# Patient Record
Sex: Female | Born: 1970 | Race: Black or African American | Hispanic: No | Marital: Married | Smoking: Never smoker
Health system: Southern US, Community
[De-identification: ages and names within clinical notes are randomized; demographics above are authoritative.]

## PROBLEM LIST (undated history)

## (undated) DIAGNOSIS — Z8371 Family history of colonic polyps: Secondary | ICD-10-CM

## (undated) DIAGNOSIS — Z83719 Family history of colon polyps, unspecified: Secondary | ICD-10-CM

## (undated) DIAGNOSIS — M62830 Muscle spasm of back: Secondary | ICD-10-CM

## (undated) DIAGNOSIS — K219 Gastro-esophageal reflux disease without esophagitis: Secondary | ICD-10-CM

## (undated) DIAGNOSIS — R112 Nausea with vomiting, unspecified: Secondary | ICD-10-CM

## (undated) DIAGNOSIS — Z973 Presence of spectacles and contact lenses: Secondary | ICD-10-CM

## (undated) DIAGNOSIS — Z87898 Personal history of other specified conditions: Secondary | ICD-10-CM

## (undated) DIAGNOSIS — D649 Anemia, unspecified: Secondary | ICD-10-CM

## (undated) DIAGNOSIS — Z9889 Other specified postprocedural states: Secondary | ICD-10-CM

## (undated) DIAGNOSIS — Z8742 Personal history of other diseases of the female genital tract: Secondary | ICD-10-CM

## (undated) DIAGNOSIS — I499 Cardiac arrhythmia, unspecified: Secondary | ICD-10-CM

## (undated) HISTORY — DX: Personal history of other diseases of the female genital tract: Z87.42

## (undated) HISTORY — DX: Family history of colon polyps, unspecified: Z83.719

## (undated) HISTORY — DX: Personal history of other specified conditions: Z87.898

## (undated) HISTORY — PX: ABDOMINAL HYSTERECTOMY: SHX81

## (undated) HISTORY — DX: Gastro-esophageal reflux disease without esophagitis: K21.9

## (undated) HISTORY — PX: COLONOSCOPY: SHX174

## (undated) HISTORY — DX: Family history of colonic polyps: Z83.71

---

## 2003-10-25 HISTORY — PX: DILATION AND CURETTAGE OF UTERUS: SHX78

## 2005-10-24 HISTORY — PX: TUBAL LIGATION: SHX77

## 2005-12-21 ENCOUNTER — Observation Stay: Payer: Self-pay

## 2006-01-02 ENCOUNTER — Inpatient Hospital Stay: Payer: Self-pay

## 2007-04-23 ENCOUNTER — Encounter (INDEPENDENT_AMBULATORY_CARE_PROVIDER_SITE_OTHER): Payer: Self-pay | Admitting: *Deleted

## 2007-04-23 ENCOUNTER — Ambulatory Visit: Payer: Self-pay

## 2007-07-19 ENCOUNTER — Ambulatory Visit: Payer: Self-pay | Admitting: Cardiology

## 2008-01-09 LAB — HM MAMMOGRAPHY

## 2009-10-24 HISTORY — PX: DILATION AND CURETTAGE OF UTERUS: SHX78

## 2010-04-29 ENCOUNTER — Ambulatory Visit: Payer: Self-pay

## 2010-05-06 ENCOUNTER — Ambulatory Visit: Payer: Self-pay

## 2010-05-10 LAB — PATHOLOGY REPORT

## 2010-07-24 HISTORY — PX: LAPAROSCOPIC SUPRACERVICAL HYSTERECTOMY: SUR797

## 2010-08-04 ENCOUNTER — Ambulatory Visit: Payer: Self-pay

## 2010-08-12 ENCOUNTER — Ambulatory Visit: Payer: Self-pay

## 2011-03-08 NOTE — Assessment & Plan Note (Signed)
[facility]                            CARDIOLOGY OFFICE NOTE   NAME:Burson, Neera                        MRN:          [id number]  DATE:07/19/2007                            DOB:          August 14, 1971    Ms. Farace is a 40 year old female with no significant past medical  history who we are asked to evaluate for pre-syncope and orthostatic  symptoms. The patient has no prior cardiac history. Over the past one  year, she has noticed that she feels dizzy at times. This is when she  changes position suddenly. This would occur from the laying to the  sitting position, and also from the squatting to the standing position.  She will feel transiently dizzy and it resolves quickly. Note that she  has not had frank syncope. There is no associated shortness of breath,  chest pain, or palpitations. Note that she denies any dyspnea on  exertion, orthopnea, PND, or pedal edema. She did have an echocardiogram  performed in June of this year that showed normal LV function. 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 There was mild mitral valve prolapse  involving the anterior and posterior leaflets with mild mitral  regurgitation. She apparently has undergone a neurological evaluation  for the above symptoms. She has had an MRI/MRA of the brain, e.g.  carotid Dopplers. None of these were apparently remarkable per Dr.  Fredda Hammed notes. Because of the above, we were asked to further  evaluate.   MEDICATIONS:  She is on no medications at present.   ALLERGIES:  No known drug allergies.   SOCIAL HISTORY:  She does not smoke and only rarely consumes alcohol.   FAMILY HISTORY:  Negative for coronary artery disease, sudden death, or  arrhythmia.   PAST MEDICAL HISTORY:  There is no diabetes mellitus, hypertension, or  hyperlipidemia. She has had a prior [facility] and a tubal ligation, but there  are no other surgeries noted.   REVIEW OF SYSTEMS:  She denies any headaches or  previous chills. There  is no productive cough or hemoptysis. There is no dysphagia,  odynophagia, melena, or hematochezia. There is no dysuria or hematuria.  There is no rash or seizure activity. There is orthopnea, PND, or pedal  edema. The remaining systems are negative.   PHYSICAL EXAMINATION:  VITAL SIGNS:  In the laying position, her heart  rate is 67, with a blood pressure of 95/63. In the sitting position, her  heart rate is 72, with a blood pressure of 100/70. In the standing  position, she had a heart rate of 75 with a blood pressure of 90/62.  Note that she does state that her systolic blood pressure sometimes runs  in the 80 range.  GENERAL:  She well developed, well nourished and in no acute distress.  SKIN:  Warm and dry.  PSYCHIATRIC:  She does not appear depressed.  EXTREMITIES:  No peripheral clubbing.  BACK:  Normal.  HEENT:  Normal with normal eyelids.  NECK:  Supple with a normal upstroke bilaterally and there no bruits  noted. There is no jugular venous  distension and no thyromegaly is  noted.  CHEST:  Clear to auscultation in all expansion.  CARDIOVASCULAR:  Regular rate and rhythm, normal S1, S2. There are no  murmurs, rubs, or gallops noted. Her PMI is non-displaced.  ABDOMEN:  Nontender. Positive bowel sounds. No hepatosplenomegaly. No  masses appreciated. There is no abdominal bruit. She has 2+ femoral  pulses bilaterally and no bruits noted  EXTREMITIES:  No edema and I could palpate no cords. She has 2+  posterior tibial pulses bilaterally.  NEUROLOGIC:  Grossly intact.   Her electrocardiogram shows a sinus rhythm at a rate of 66. The axis is  normal. There are no ST changes. Her intervals are normal.   DIAGNOSES:  1. Dizziness/orthostasis type of symptoms - Ms. Canterbury is complaining      of some dizziness with standing only. This resolves fairly quickly      and does not appear to be significantly limiting. Note that her      systolic blood pressure  typically runs in the 90 range. Her      symptoms do sound to be orthostatic. I have asked her to      liberalized her salt intake, as well as her fluid intake.      Hopefully, this will correct the problem. If she has worsening      symptoms in the future, then we can consider adding medications to      improve her blood pressure, such as Proamatine. However, these do      not appear to be limiting at this point. Note that she has never      had any syncope. Her LV function is normal.  2. Mild mitral valve prolapse - she is not having any symptoms from      this. She may require follow up echocardiograms in the future.   I will see her back in six months.     Madolyn Frieze Jens Som, MD, [facility]  Electronically Signed    BSC/MedQ  DD: 07/19/2007  DT: 07/20/2007  Job #: [id number]   cc:   Mila Merry

## 2011-05-06 ENCOUNTER — Ambulatory Visit: Payer: Self-pay | Admitting: Urology

## 2011-05-20 ENCOUNTER — Ambulatory Visit: Payer: Self-pay | Admitting: Urology

## 2011-09-12 ENCOUNTER — Ambulatory Visit: Payer: Self-pay | Admitting: Gastroenterology

## 2011-09-13 LAB — PATHOLOGY REPORT

## 2011-10-25 LAB — HM PAP SMEAR: HM Pap smear: ABNORMAL

## 2013-01-07 ENCOUNTER — Ambulatory Visit: Payer: Self-pay | Admitting: Internal Medicine

## 2013-01-08 ENCOUNTER — Encounter: Payer: Self-pay | Admitting: Internal Medicine

## 2013-01-08 ENCOUNTER — Ambulatory Visit (INDEPENDENT_AMBULATORY_CARE_PROVIDER_SITE_OTHER): Payer: BC Managed Care – PPO | Admitting: Internal Medicine

## 2013-01-08 ENCOUNTER — Telehealth: Payer: Self-pay | Admitting: *Deleted

## 2013-01-08 VITALS — BP 108/60 | HR 79 | Temp 98.5°F | Ht 59.15 in | Wt 146.2 lb

## 2013-01-08 DIAGNOSIS — K5909 Other constipation: Secondary | ICD-10-CM

## 2013-01-08 DIAGNOSIS — N809 Endometriosis, unspecified: Secondary | ICD-10-CM

## 2013-01-08 DIAGNOSIS — K219 Gastro-esophageal reflux disease without esophagitis: Secondary | ICD-10-CM

## 2013-01-08 LAB — COMPREHENSIVE METABOLIC PANEL
ALT: 14 U/L (ref 0–35)
AST: 19 U/L (ref 0–37)
Albumin: 4.1 g/dL (ref 3.5–5.2)
Calcium: 8.9 mg/dL (ref 8.4–10.5)
Chloride: 105 mEq/L (ref 96–112)
Potassium: 3.8 mEq/L (ref 3.5–5.1)
Total Protein: 7.5 g/dL (ref 6.0–8.3)

## 2013-01-08 LAB — CBC WITH DIFFERENTIAL/PLATELET
Basophils Absolute: 0 10*3/uL (ref 0.0–0.1)
Basophils Relative: 0.4 % (ref 0.0–3.0)
Eosinophils Absolute: 0 10*3/uL (ref 0.0–0.7)
Lymphocytes Relative: 37.7 % (ref 12.0–46.0)
MCHC: 33.6 g/dL (ref 30.0–36.0)
Neutrophils Relative %: 53.5 % (ref 43.0–77.0)
RBC: 4.19 Mil/uL (ref 3.87–5.11)
RDW: 13.6 % (ref 11.5–14.6)

## 2013-01-08 LAB — LIPID PANEL
LDL Cholesterol: 110 mg/dL — ABNORMAL HIGH (ref 0–99)
Total CHOL/HDL Ratio: 3

## 2013-01-08 NOTE — Telephone Encounter (Signed)
Faxed letter to fax number listed below

## 2013-01-08 NOTE — Telephone Encounter (Signed)
Patient left message on voicemail stating she need a work note faxed to her job for her visit today. Please fax to [phone] or call back at [phone]

## 2013-01-09 ENCOUNTER — Telehealth: Payer: Self-pay | Admitting: Internal Medicine

## 2013-01-09 NOTE — Telephone Encounter (Signed)
Notified of labs via my chart 

## 2013-01-13 ENCOUNTER — Encounter: Payer: Self-pay | Admitting: Internal Medicine

## 2013-01-13 DIAGNOSIS — N809 Endometriosis, unspecified: Secondary | ICD-10-CM | POA: Insufficient documentation

## 2013-01-13 DIAGNOSIS — K219 Gastro-esophageal reflux disease without esophagitis: Secondary | ICD-10-CM | POA: Insufficient documentation

## 2013-01-13 DIAGNOSIS — R87619 Unspecified abnormal cytological findings in specimens from cervix uteri: Secondary | ICD-10-CM | POA: Insufficient documentation

## 2013-01-13 DIAGNOSIS — K5909 Other constipation: Secondary | ICD-10-CM | POA: Insufficient documentation

## 2013-01-13 NOTE — Progress Notes (Signed)
  Subjective:    Patient ID: Lorraine Morgan, female    DOB: [birth year], 42 y.o.   MRN: [id number]  HPI 42 year old female with past history of abnormal pap smears and endometriosis who comes in today to establish care.  Has been followed by Dr Sherrie Mustache.  Gets her gyn care at [facility].  Has been seeing Dr Luella Cook.  Has an appt next week.  He is following her for abnormal pap smears.  Had a normal pap last year.  Had a baseline mammogram at age 24.  Previously had problems with headaches.  Saw neurology.  Had an extensive w/up including MRI, EEG (normal) and EKG.  No headaches now.  Had some reflux issues and was evaluated by GI - Dr Marva Panda and [name].  Had EGD 09/12/11.  Treated for H. Pylori.  Has follow up planned with GI 5/14.  Some issues with constipation.  Had a colonoscopy 2003.  No urinary problems.    Past Medical History  Diagnosis Date  . History of abnormal Pap smear   . History of endometriosis   . GERD (gastroesophageal reflux disease)     H. pylori positive - EGD (09/12/11)     Outpatient Encounter Prescriptions as of 01/08/2013  Medication Sig Dispense Refill  . cyclobenzaprine (FLEXERIL) 5 MG tablet Take 5 mg by mouth as needed for muscle spasms.      . hydrocortisone (ANUSOL-HC) 25 MG suppository Place 25 mg rectally 2 (two) times daily as needed for hemorrhoids.      Marland Kitchen omeprazole (PRILOSEC) 20 MG capsule Take 20 mg by mouth daily.       No facility-administered encounter medications on file as of 01/08/2013.    Review of Systems Patient denies any headache, lightheadedness or dizziness.  No sinus or allergy issues.  No chest pain, tightness or palpitations.  No increased shortness of breath, cough or congestion.  No nausea or vomiting.  No increased acid reflux reported today.  Previously treated for H. Pylori.  On omeprazole.  No abdominal pain or cramping.  No bowel change, such as diarrhea,  BRBPR or melana.  Has had some chronic issues with constipation.  No urine  change.         Objective:   Physical Exam Filed Vitals:   01/08/13 1016  BP: 108/60  Pulse: 79  Temp: 98.5 F (33.58 C)   42 year old female in no acute distress.   HEENT:  Nares- clear.  Oropharynx - without lesions. NECK:  Supple.  Nontender.  No audible bruit.  HEART:  Appears to be regular. LUNGS:  No crackles or wheezing audible.  Respirations even and unlabored.  BREASTS:  Exam through gyn.  RADIAL PULSE:  Equal bilaterally.    ABDOMEN:  Soft, nontender.  Bowel sounds present and normal.  No audible abdominal bruit.  GU:  Exam performed through GYN.     EXTREMITIES:  No increased edema present.  DP pulses palpable and equal bilaterally.          Assessment & Plan:  HEALTH MAINTENANCE.  Planning to get her breast, pelvic and pap smear Monday 01/14/13 at [facility].  (Dr Luella Cook).  He will schedule her mammogram when due.  Check cholesterol.

## 2013-01-13 NOTE — Assessment & Plan Note (Signed)
EGD 11/12.  H. Pylori positive.  Treated.  On prilosec.  Follow.  Continues follow up with GI.

## 2013-01-13 NOTE — Assessment & Plan Note (Signed)
Seeing GI.  Follow.

## 2013-01-13 NOTE — Assessment & Plan Note (Signed)
Has a follow up with Dr Luella Cook on Monday 01/14/13 - for a repeat pap.  Obtain records.

## 2013-01-13 NOTE — Assessment & Plan Note (Signed)
Seeing GYN

## 2013-01-26 ENCOUNTER — Encounter: Payer: Self-pay | Admitting: Internal Medicine

## 2013-01-26 DIAGNOSIS — K219 Gastro-esophageal reflux disease without esophagitis: Secondary | ICD-10-CM

## 2013-03-26 ENCOUNTER — Other Ambulatory Visit: Payer: Self-pay | Admitting: *Deleted

## 2013-03-26 ENCOUNTER — Telehealth: Payer: Self-pay | Admitting: Emergency Medicine

## 2013-03-26 ENCOUNTER — Encounter: Payer: Self-pay | Admitting: Adult Health

## 2013-03-26 ENCOUNTER — Ambulatory Visit (INDEPENDENT_AMBULATORY_CARE_PROVIDER_SITE_OTHER): Payer: BC Managed Care – PPO | Admitting: Adult Health

## 2013-03-26 VITALS — BP 100/62 | HR 76 | Temp 98.3°F | Wt 145.0 lb

## 2013-03-26 DIAGNOSIS — H109 Unspecified conjunctivitis: Secondary | ICD-10-CM | POA: Insufficient documentation

## 2013-03-26 MED ORDER — CIPROFLOXACIN HCL 0.3 % OP SOLN: OPHTHALMIC | Status: DC

## 2013-03-26 MED ORDER — AZITHROMYCIN 1 % OP SOLN: 1.0000 [drp] | Freq: Two times a day (BID) | OPHTHALMIC | Status: DC

## 2013-03-26 NOTE — Telephone Encounter (Signed)
Patient stated she went to the pharmacy and was too expensive, she was told to call back if she could not afford. Please advise. She has also left a message on Becky's VM

## 2013-03-26 NOTE — Assessment & Plan Note (Signed)
Bilateral eyes. Start azithromycin ophthalmic solution one drop twice a day x1 day then 1 drop daily x4 days.

## 2013-03-26 NOTE — Telephone Encounter (Signed)
Ok to change to [employer] op

## 2013-03-26 NOTE — Progress Notes (Signed)
Pt called and stated azithromycin eye drops were too expensive, new Rx sent to pharmacy per Orville Govern, NP.

## 2013-03-26 NOTE — Patient Instructions (Addendum)
  Use Azithromycin Op Solution - 1 drop in both eyes twice a day for one day; then one drop in both eyes daily for 4 days  Cool compresses to both eyes  Gently remove any crusting  Wash hands to prevent contamination  Use a new pair of contacts once the infection has cleared.

## 2013-03-26 NOTE — Progress Notes (Signed)
  Subjective:    Patient ID: Lorraine Morgan, female    DOB: 1971-08-20, 42 y.o.   MRN: [id number]  HPI  Patient presents with crusting, drainage and matting of bilateral eyes. Symptoms began over the weekend. She removed her contacts and started to use her eyeglasses. Groin he sensation in eyes. No visual disturbances reported.  Current Outpatient Prescriptions on File Prior to Visit  Medication Sig Dispense Refill  . omeprazole (PRILOSEC) 20 MG capsule Take 20 mg by mouth daily.      . cyclobenzaprine (FLEXERIL) 5 MG tablet Take 5 mg by mouth as needed for muscle spasms.       No current facility-administered medications on file prior to visit.     Review of Systems  Constitutional: Negative for fever and chills.  Eyes: Positive for discharge, redness and itching. Negative for photophobia, pain and visual disturbance.       Objective:   Physical Exam  Constitutional: She is oriented to person, place, and time. She appears well-developed and well-nourished. No distress.  Eyes: EOM are normal. Pupils are equal, round, and reactive to light. Right eye exhibits discharge. Left eye exhibits discharge.  Neurological: She is alert and oriented to person, place, and time.  Psychiatric: She has a normal mood and affect. Her behavior is normal. Judgment and thought content normal.          Assessment & Plan:

## 2013-03-26 NOTE — Telephone Encounter (Signed)
Pt notified of new Rx sent to pharmacy.

## 2013-06-26 ENCOUNTER — Ambulatory Visit: Payer: BC Managed Care – PPO | Admitting: Adult Health

## 2013-08-29 ENCOUNTER — Other Ambulatory Visit: Payer: Self-pay

## 2013-12-09 ENCOUNTER — Telehealth: Payer: Self-pay | Admitting: Internal Medicine

## 2013-12-09 NOTE — Telephone Encounter (Signed)
Pt notified & will call back tomorrow for possible work in appointment if office is open

## 2013-12-09 NOTE — Telephone Encounter (Signed)
If continuing to flare, she probably needs to be evaluated - may need to be on oral prednisone.  I do not mind working her in tomorrow if we are here.  Since closing early - unable to work in today.

## 2013-12-09 NOTE — Telephone Encounter (Signed)
Pt wanted to make an appt.  Office closing early.  Wanted to give Dr. Lorin PicketScott a message:  Pt was seen at urgent care last Wednesday and they told her she has contact dermatitis.  It is on her hands and arms, mostly palms of hands.  She was given a shot of prednisone and topical cream to use.  States it is not helping.  Says it goes in and then flames up.  States it is now on her inner thigh as well.  Asking if she needs to try something something different.

## 2013-12-12 ENCOUNTER — Encounter: Payer: Self-pay | Admitting: Internal Medicine

## 2013-12-12 ENCOUNTER — Ambulatory Visit (INDEPENDENT_AMBULATORY_CARE_PROVIDER_SITE_OTHER): Payer: Commercial Managed Care - PPO | Admitting: Internal Medicine

## 2013-12-12 VITALS — BP 108/80 | HR 85 | Temp 98.2°F | Ht 59.15 in | Wt 146.0 lb

## 2013-12-12 DIAGNOSIS — R21 Rash and other nonspecific skin eruption: Secondary | ICD-10-CM

## 2013-12-15 ENCOUNTER — Encounter: Payer: Self-pay | Admitting: Internal Medicine

## 2013-12-15 DIAGNOSIS — R21 Rash and other nonspecific skin eruption: Secondary | ICD-10-CM | POA: Insufficient documentation

## 2013-12-15 NOTE — Progress Notes (Signed)
  Subjective:    Patient ID: Lorraine Morgan, female    DOB: 08/24/1971, 43 y.o.   MRN: [id number]  Rash  43 year old female with past history of abnormal pap smears and endometriosis who comes in today as a work in with concerns regarding a persistent rash.  States she first noticed "spots" on her hands.  The rash then spread up to her wrist and then up her forearm.  She also has noticed a few spots on the bottom of her feet and inner thighs.  Went to acute care.  Was given a steroid shot and given triamcinolone cream.  Rash persistent.  Increased itching.  Just started zyrtec.     Past Medical History  Diagnosis Date  . History of abnormal Pap smear   . History of endometriosis   . GERD (gastroesophageal reflux disease)     H. pylori positive - EGD (09/12/11)    Outpatient Encounter Prescriptions as of 12/12/2013  Medication Sig  . cyclobenzaprine (FLEXERIL) 5 MG tablet Take 5 mg by mouth as needed for muscle spasms.  . hydrocortisone (ANUSOL-HC) 25 MG suppository Place 25 mg rectally daily as needed for hemorrhoids.   Marland Kitchen. omeprazole (PRILOSEC) 20 MG capsule Take 20 mg by mouth daily.  Marland Kitchen. triamcinolone cream (KENALOG) 0.5 % Apply 1 application topically 2 (two) times daily.  . [DISCONTINUED] azithromycin (AZASITE) 1 % ophthalmic solution Place 1 drop into both eyes 2 (two) times daily.  . [DISCONTINUED] ciprofloxacin (CILOXAN) 0.3 % ophthalmic solution Administer 1 drop, every 2 hours, while awake, for 2 days. Then 1 drop, every 4 hours, while awake, for the next 5 days.    Review of Systems  Skin: Positive for rash.  Patient denies any headache, lightheadedness or dizziness.  No sinus or allergy issues.  No sore throat.  No fever.  No body aches.  No increased shortness of breath, cough or congestion.  No nausea or vomiting.  No bowel change.  Rash as outlined.           Objective:   Physical Exam  Filed Vitals:   12/12/13 1529  BP: 108/80  Pulse: 85  Temp: 98.2 F (4536.548 C)   43  year old female in no acute distress.   HEENT:  Nares- clear.  Oropharynx - without lesions. NECK:  Supple.  Nontender.  SKIN:  Erythematous based rash noted on wrist and forearm.  Minimal rash on soles of her feet and inner thighs.            Assessment & Plan:  HEALTH MAINTENANCE.  Pelvic[facility] pap smear and [facility]ograms [facility].

## 2013-12-15 NOTE — Assessment & Plan Note (Signed)
Persistent.  Itching.  No new exposures except for detergent.  No fever or other symptoms.  Concern over possible allergic reaction/contact dermatitis.  Continue zyrtec.  Medrol dose pack - 6 day taper.  Follow.  If persistent rash, will need dermatology referral.

## 2013-12-20 ENCOUNTER — Telehealth: Payer: Self-pay | Admitting: *Deleted

## 2013-12-20 DIAGNOSIS — R21 Rash and other nonspecific skin eruption: Secondary | ICD-10-CM

## 2013-12-20 NOTE — Telephone Encounter (Signed)
noted 

## 2013-12-20 NOTE — Telephone Encounter (Signed)
Pt was seen recently & calling to report that the itching started back after completing the steroid taper. The hives are back again. Had discussed dermatology referral as next step.Pt states that she is willing to proceed with referral.

## 2013-12-20 NOTE — Telephone Encounter (Signed)
I [facility].  They should be contacting her with an appt date and time.

## 2013-12-31 ENCOUNTER — Telehealth: Payer: Self-pay | Admitting: Internal Medicine

## 2013-12-31 DIAGNOSIS — Z13220 Encounter for screening for lipoid disorders: Secondary | ICD-10-CM

## 2013-12-31 DIAGNOSIS — R21 Rash and other nonspecific skin eruption: Secondary | ICD-10-CM

## 2013-12-31 NOTE — Telephone Encounter (Signed)
I have ordered the labs.  Please schedule her for a lab appt.  Thanks.

## 2013-12-31 NOTE — Telephone Encounter (Signed)
Pt left vm.  States she has cpe scheduled 3/26 and would like to come in sooner for fasting labs.  No orders in.  No lab appt scheduled at this time.   Pt states she can be reached today and tomorrow at work [phone] in [place] office, or Thursday and Friday will be working at [place] office at [phone].  Pt states after 5 p.m. She can be reached on her cell at [phone].

## 2014-01-01 NOTE — Telephone Encounter (Signed)
Appointment 3/20  Sent my chart message letting pt know about appointment

## 2014-01-08 ENCOUNTER — Other Ambulatory Visit (INDEPENDENT_AMBULATORY_CARE_PROVIDER_SITE_OTHER): Payer: Commercial Managed Care - PPO

## 2014-01-08 ENCOUNTER — Other Ambulatory Visit: Payer: Self-pay | Admitting: Internal Medicine

## 2014-01-08 DIAGNOSIS — Z13220 Encounter for screening for lipoid disorders: Secondary | ICD-10-CM

## 2014-01-08 DIAGNOSIS — R7989 Other specified abnormal findings of blood chemistry: Secondary | ICD-10-CM

## 2014-01-08 DIAGNOSIS — R946 Abnormal results of thyroid function studies: Secondary | ICD-10-CM

## 2014-01-08 DIAGNOSIS — R21 Rash and other nonspecific skin eruption: Secondary | ICD-10-CM

## 2014-01-08 LAB — CBC WITH DIFFERENTIAL/PLATELET
BASOS PCT: 0.5 % (ref 0.0–3.0)
Basophils Absolute: 0 10*3/uL (ref 0.0–0.1)
EOS PCT: 1 % (ref 0.0–5.0)
Eosinophils Absolute: 0 10*3/uL (ref 0.0–0.7)
HCT: 36.9 % (ref 36.0–46.0)
HEMOGLOBIN: 12.2 g/dL (ref 12.0–15.0)
LYMPHS PCT: 48.7 % — AB (ref 12.0–46.0)
Lymphs Abs: 2.1 10*3/uL (ref 0.7–4.0)
MCHC: 33.1 g/dL (ref 30.0–36.0)
MCV: 91.2 fl (ref 78.0–100.0)
MONOS PCT: 9.8 % (ref 3.0–12.0)
Monocytes Absolute: 0.4 10*3/uL (ref 0.1–1.0)
NEUTROS ABS: 1.7 10*3/uL (ref 1.4–7.7)
Neutrophils Relative %: 40 % — ABNORMAL LOW (ref 43.0–77.0)
Platelets: 226 10*3/uL (ref 150.0–400.0)
RBC: 4.05 Mil/uL (ref 3.87–5.11)
RDW: 13.3 % (ref 11.5–14.6)
WBC: 4.2 10*3/uL — ABNORMAL LOW (ref 4.5–10.5)

## 2014-01-08 LAB — COMPREHENSIVE METABOLIC PANEL
ALBUMIN: 4 g/dL (ref 3.5–5.2)
ALK PHOS: 47 U/L (ref 39–117)
ALT: 10 U/L (ref 0–35)
AST: 18 U/L (ref 0–37)
BILIRUBIN TOTAL: 0.6 mg/dL (ref 0.3–1.2)
BUN: 17 mg/dL (ref 6–23)
CO2: 26 mEq/L (ref 19–32)
Calcium: 8.9 mg/dL (ref 8.4–10.5)
Chloride: 106 mEq/L (ref 96–112)
Creatinine, Ser: 0.7 mg/dL (ref 0.4–1.2)
GFR: 113.34 mL/min (ref >60.00)
Glucose, Bld: 99 mg/dL (ref 70–99)
Potassium: 3.8 mEq/L (ref 3.5–5.1)
SODIUM: 138 meq/L (ref 135–145)
Total Protein: 6.8 g/dL (ref 6.0–8.3)

## 2014-01-08 LAB — TSH: TSH: 0.27 u[IU]/mL — ABNORMAL LOW (ref 0.35–5.50)

## 2014-01-08 LAB — LIPID PANEL
CHOLESTEROL: 185 mg/dL (ref 0–200)
HDL: 61.3 mg/dL (ref >39.00)
LDL CALC: 117 mg/dL — AB (ref 0–99)
Total CHOL/HDL Ratio: 3
Triglycerides: 35 mg/dL (ref 0.0–149.0)
VLDL: 7 mg/dL (ref 0.0–40.0)

## 2014-01-08 LAB — T3, FREE: T3, Free: 2.7 pg/mL (ref 2.3–4.2)

## 2014-01-08 LAB — T4, FREE: FREE T4: 0.8 ng/dL (ref 0.60–1.60)

## 2014-01-08 NOTE — Progress Notes (Signed)
Order placed for free t3 and free t4 

## 2014-01-09 ENCOUNTER — Encounter: Payer: Self-pay | Admitting: Internal Medicine

## 2014-01-10 ENCOUNTER — Other Ambulatory Visit: Payer: Commercial Managed Care - PPO

## 2014-01-13 NOTE — Telephone Encounter (Signed)
Mailed unread MyChart pt

## 2014-01-16 ENCOUNTER — Encounter: Payer: Self-pay | Admitting: Internal Medicine

## 2014-01-16 ENCOUNTER — Ambulatory Visit (INDEPENDENT_AMBULATORY_CARE_PROVIDER_SITE_OTHER): Payer: Commercial Managed Care - PPO | Admitting: Internal Medicine

## 2014-01-16 VITALS — BP 120/60 | HR 82 | Temp 98.3°F | Ht 60.0 in | Wt 147.5 lb

## 2014-01-16 DIAGNOSIS — Z8371 Family history of colonic polyps: Secondary | ICD-10-CM

## 2014-01-16 DIAGNOSIS — N809 Endometriosis, unspecified: Secondary | ICD-10-CM

## 2014-01-16 DIAGNOSIS — R21 Rash and other nonspecific skin eruption: Secondary | ICD-10-CM

## 2014-01-16 DIAGNOSIS — R7989 Other specified abnormal findings of blood chemistry: Secondary | ICD-10-CM

## 2014-01-16 DIAGNOSIS — Z83719 Family history of colon polyps, unspecified: Secondary | ICD-10-CM

## 2014-01-16 DIAGNOSIS — D72819 Decreased white blood cell count, unspecified: Secondary | ICD-10-CM

## 2014-01-16 DIAGNOSIS — K5909 Other constipation: Secondary | ICD-10-CM

## 2014-01-16 DIAGNOSIS — R946 Abnormal results of thyroid function studies: Secondary | ICD-10-CM

## 2014-01-16 DIAGNOSIS — K219 Gastro-esophageal reflux disease without esophagitis: Secondary | ICD-10-CM

## 2014-01-16 DIAGNOSIS — K5900 Constipation, unspecified: Secondary | ICD-10-CM

## 2014-01-16 DIAGNOSIS — Z800 Family history of malignant neoplasm of digestive organs: Secondary | ICD-10-CM

## 2014-01-16 MED ORDER — HYDROCORTISONE ACETATE 25 MG RE SUPP: 25.0000 mg | Freq: Every day | RECTAL | Status: DC | PRN

## 2014-01-16 NOTE — Assessment & Plan Note (Signed)
EGD 11/12.  H. Pylori positive.  Treated.  On prilosec.  Follow.  Continues follow up with GI.  Currently asymptomatic - no upper symptoms.

## 2014-01-16 NOTE — Progress Notes (Signed)
Subjective:    Patient ID: Lorraine Morgan, female    DOB: 09/26/1971, 43 y.o.   MRN: [id number]  HPI 43 year old female with past history of abnormal pap smears and endometriosis who comes in today for her physical exam.   [facility]  Has been seeing Lorraine Morgan.  Has an appt today.   He is following her for abnormal pap smears.  Had a baseline mammogram at age 89.  Just had a f/u mammogram today.  Previously had problems with headaches.  Saw neurology.  Had an extensive w/up including MRI, EEG (normal) and EKG.  No headaches now.  Had some reflux issues and was evaluated by GI - Lorraine Morgan and Lorraine Morgan.  Had EGD 09/12/11.  Treated for H. Pylori.  No signifcant issues with constipation.  Will notice some rectal pain intermittently.  Throbbing pain.  Suppositories help.   Had a colonoscopy 2003.  No urinary problems.  Father had polyps died of lung cancer.  Paternal grandmother and multiple great aunts and uncles - colon cancer.    Recently was evaluated by dermatology for persistent rash.  Treated for scabies x 2.  No significant change.  Got some better with prednisone.  Due for f/u 02/05/14.  Will probably get bx then.  Still unclear of etiology.     Past Medical History  Diagnosis Date  . History of abnormal Pap smear   . History of endometriosis   . GERD (gastroesophageal reflux disease)     H. pylori positive - EGD (09/12/11)    Outpatient Encounter Prescriptions as of 01/16/2014  Medication Sig  . cetirizine (ZYRTEC) 10 MG tablet Take 10 mg by mouth daily.  . cyclobenzaprine (FLEXERIL) 5 MG tablet Take 5 mg by mouth as needed for muscle spasms.  . hydrocortisone (ANUSOL-HC) 25 MG suppository Place 25 mg rectally daily as needed for hemorrhoids.   Marland Kitchen. omeprazole (PRILOSEC) 20 MG capsule Take 20 mg by mouth daily.  . [DISCONTINUED] triamcinolone cream (KENALOG) 0.5 % Apply 1 application topically 2 (two) times daily.    Review of Systems Patient denies any headache,  lightheadedness or dizziness.  No sinus or allergy issues.  No chest pain, tightness or palpitations.  No increased shortness of breath, cough or congestion.  No nausea or vomiting.  No increased acid reflux reported today.  Previously treated for H. Pylori.  On omeprazole.  No abdominal pain or cramping.  No bowel change, such as diarrhea,  BRBPR or melana.  No significant constipation.  Rectal burning at times.  No urine change.  Rash as outlined.        Objective:   Physical Exam  Filed Vitals:   01/16/14 1341  BP: 120/60  Pulse: 82  Temp: 98.3 F (3736.868 C)   43 year old female in no acute distress.   HEENT:  Nares- clear.  Oropharynx - without lesions. NECK:  Supple.  Nontender.  No audible bruit.  HEART:  Appears to be regular. LUNGS:  No crackles or wheezing audible.  Respirations even and unlabored.  BREASTS:  Exam through gyn.  RADIAL PULSE:  Equal bilaterally.    ABDOMEN:  Soft, nontender.  Bowel sounds present and normal.  No audible abdominal bruit.  GU:  Exam performed through GYN.     EXTREMITIES:  No increased edema present.  DP pulses palpable and equal bilaterally.          Assessment & Plan:  HEALTH MAINTENANCE.  Got her breast, pelvic  and pap smear at [facility].  (Lorraine Morgan).  Had her mammogram today.   Check cholesterol.   I spent 25 minutes with the patient and more than 50% of the time was spent in consultation regarding the above.

## 2014-01-16 NOTE — Assessment & Plan Note (Signed)
Denies any significant constipation now.  Bowels stable.  Some rectal burning.  Refilled suppositories.  Refer to GI for colon evaluation - given bowel issues, persistent intermittent rectal irritation and family history.

## 2014-01-16 NOTE — Assessment & Plan Note (Signed)
Seeing dermatology.  Still unclear etiology.  Keep f/u appt with them.

## 2014-01-16 NOTE — Assessment & Plan Note (Signed)
Saw gyn today.  Had repeat pap.  Obtain records.

## 2014-01-16 NOTE — Progress Notes (Signed)
Pre-visit discussion using our clinic review tool. No additional management support is needed unless otherwise documented below in the visit note.  

## 2014-01-16 NOTE — Assessment & Plan Note (Signed)
Seeing GYN

## 2014-02-13 ENCOUNTER — Other Ambulatory Visit (INDEPENDENT_AMBULATORY_CARE_PROVIDER_SITE_OTHER): Payer: Commercial Managed Care - PPO

## 2014-02-13 ENCOUNTER — Encounter: Payer: Self-pay | Admitting: Internal Medicine

## 2014-02-13 DIAGNOSIS — D72819 Decreased white blood cell count, unspecified: Secondary | ICD-10-CM

## 2014-02-13 DIAGNOSIS — R7989 Other specified abnormal findings of blood chemistry: Secondary | ICD-10-CM

## 2014-02-13 DIAGNOSIS — R946 Abnormal results of thyroid function studies: Secondary | ICD-10-CM

## 2014-02-13 LAB — CBC WITH DIFFERENTIAL/PLATELET
BASOS ABS: 0 10*3/uL (ref 0.0–0.1)
Basophils Relative: 0.7 % (ref 0.0–3.0)
Eosinophils Absolute: 0 10*3/uL (ref 0.0–0.7)
Eosinophils Relative: 0.8 % (ref 0.0–5.0)
HEMATOCRIT: 39.3 % (ref 36.0–46.0)
HEMOGLOBIN: 13 g/dL (ref 12.0–15.0)
LYMPHS ABS: 2.3 10*3/uL (ref 0.7–4.0)
Lymphocytes Relative: 49.1 % — ABNORMAL HIGH (ref 12.0–46.0)
MCHC: 33 g/dL (ref 30.0–36.0)
MCV: 91.7 fl (ref 78.0–100.0)
MONO ABS: 0.5 10*3/uL (ref 0.1–1.0)
Monocytes Relative: 10.5 % (ref 3.0–12.0)
NEUTROS ABS: 1.8 10*3/uL (ref 1.4–7.7)
Neutrophils Relative %: 38.9 % — ABNORMAL LOW (ref 43.0–77.0)
PLATELETS: 193 10*3/uL (ref 150.0–400.0)
RBC: 4.28 Mil/uL (ref 3.87–5.11)
RDW: 13.6 % (ref 11.5–14.6)
WBC: 4.7 10*3/uL (ref 4.5–10.5)

## 2014-02-13 LAB — T3, FREE: T3 FREE: 2.7 pg/mL (ref 2.3–4.2)

## 2014-02-13 LAB — TSH: TSH: 0.36 u[IU]/mL (ref 0.35–5.50)

## 2014-02-13 LAB — T4, FREE: Free T4: 0.73 ng/dL (ref 0.60–1.60)

## 2014-12-01 LAB — HM PAP SMEAR: HM Pap smear: NORMAL

## 2015-01-28 LAB — HM MAMMOGRAPHY: HM Mammogram: NORMAL

## 2015-02-05 DIAGNOSIS — R928 Other abnormal and inconclusive findings on diagnostic imaging of breast: Secondary | ICD-10-CM | POA: Insufficient documentation

## 2015-07-15 ENCOUNTER — Ambulatory Visit (INDEPENDENT_AMBULATORY_CARE_PROVIDER_SITE_OTHER): Payer: BC Managed Care – PPO | Admitting: Nurse Practitioner

## 2015-07-15 ENCOUNTER — Encounter: Payer: Self-pay | Admitting: Nurse Practitioner

## 2015-07-15 VITALS — BP 102/60 | HR 61 | Temp 98.2°F | Resp 14 | Ht 60.0 in | Wt 139.4 lb

## 2015-07-15 DIAGNOSIS — Z0000 Encounter for general adult medical examination without abnormal findings: Secondary | ICD-10-CM

## 2015-07-15 NOTE — Patient Instructions (Signed)
Debrox ear wax removal system  Health Maintenance Adopting a healthy lifestyle and getting preventive care can go a long way to promote health and wellness. Talk with your health care provider about what schedule of regular examinations is right for you. This is a good chance for you to check in with your provider about disease prevention and staying healthy. In between checkups, there are plenty of things you can do on your own. Experts have done a lot of research about which lifestyle changes and preventive measures are most likely to keep you healthy. Ask your health care provider for more information. WEIGHT AND DIET  Eat a healthy diet  Be sure to include plenty of vegetables, fruits, low-fat dairy products, and lean protein.  Do not eat a lot of foods high in solid fats, added sugars, or salt.  Get regular exercise. This is one of the most important things you can do for your health.  Most adults should exercise for at least 150 minutes each week. The exercise should increase your heart rate and make you sweat (moderate-intensity exercise).  Most adults should also do strengthening exercises at least twice a week. This is in addition to the moderate-intensity exercise.  Maintain a healthy weight  Body mass index (BMI) is a measurement that can be used to identify possible weight problems. It estimates body fat based on height and weight. Your health care provider can help determine your BMI and help you achieve or maintain a healthy weight.  For females 61 years of age and older:   A BMI below 18.5 is considered underweight.  A BMI of 18.5 to 24.9 is normal.  A BMI of 25 to 29.9 is considered overweight.  A BMI of 30 and above is considered obese.  Watch levels of cholesterol and blood lipids  You should start having your blood tested for lipids and cholesterol at 42 years of age, then have this test every 5 years.  You may need to have your cholesterol levels checked more  often if:  Your lipid or cholesterol levels are high.  You are older than 42 years of age.  You are at high risk for heart disease.  CANCER SCREENING   Lung Cancer  Lung cancer screening is recommended for adults 89-43 years old who are at high risk for lung cancer because of a history of smoking.  A yearly low-dose CT scan of the lungs is recommended for people who:  Currently smoke.  Have quit within the past 15 years.  Have at least a 30-pack-year history of smoking. A pack year is smoking an average of one pack of cigarettes a day for 1 year.  Yearly screening should continue until it has been 15 years since you quit.  Yearly screening should stop if you develop a health problem that would prevent you from having lung cancer treatment.  Breast Cancer  Practice breast self-awareness. This means understanding how your breasts normally appear and feel.  It also means doing regular breast self-exams. Let your health care provider know about any changes, no matter how small.  If you are in your 20s or 30s, you should have a clinical breast exam (CBE) by a health care provider every 1-3 years as part of a regular health exam.  If you are 48 or older, have a CBE every year. Also consider having a breast X-ray (mammogram) every year.  If you have a family history of breast cancer, talk to your health care provider about  genetic screening.  If you are at high risk for breast cancer, talk to your health care provider about having an MRI and a mammogram every year.  Breast cancer gene (BRCA) assessment is recommended for women who have family members with BRCA-related cancers. BRCA-related cancers include:  Breast.  Ovarian.  Tubal.  Peritoneal cancers.  Results of the assessment will determine the need for genetic counseling and BRCA1 and BRCA2 testing. Cervical Cancer Routine pelvic examinations to screen for cervical cancer are no longer recommended for nonpregnant  women who are considered low risk for cancer of the pelvic organs (ovaries, uterus, and vagina) and who do not have symptoms. A pelvic examination may be necessary if you have symptoms including those associated with pelvic infections. Ask your health care provider if a screening pelvic exam is right for you.   The Pap test is the screening test for cervical cancer for women who are considered at risk.  If you had a hysterectomy for a problem that was not cancer or a condition that could lead to cancer, then you no longer need Pap tests.  If you are older than 65 years, and you have had normal Pap tests for the past 10 years, you no longer need to have Pap tests.  If you have had past treatment for cervical cancer or a condition that could lead to cancer, you need Pap tests and screening for cancer for at least 20 years after your treatment.  If you no longer get a Pap test, assess your risk factors if they change (such as having a new sexual partner). This can affect whether you should start being screened again.  Some women have medical problems that increase their chance of getting cervical cancer. If this is the case for you, your health care provider may recommend more frequent screening and Pap tests.  The human papillomavirus (HPV) test is another test that may be used for cervical cancer screening. The HPV test looks for the virus that can cause cell changes in the cervix. The cells collected during the Pap test can be tested for HPV.  The HPV test can be used to screen women 30 years of age and older. Getting tested for HPV can extend the interval between normal Pap tests from three to five years.  An HPV test also should be used to screen women of any age who have unclear Pap test results.  After 42 years of age, women should have HPV testing as often as Pap tests.  Colorectal Cancer  This type of cancer can be detected and often prevented.  Routine colorectal cancer screening  usually begins at 42 years of age and continues through 42 years of age.  Your health care provider may recommend screening at an earlier age if you have risk factors for colon cancer.  Your health care provider may also recommend using home test kits to check for hidden blood in the stool.  A small camera at the end of a tube can be used to examine your colon directly (sigmoidoscopy or colonoscopy). This is done to check for the earliest forms of colorectal cancer.  Routine screening usually begins at age 50.  Direct examination of the colon should be repeated every 5-10 years through 42 years of age. However, you may need to be screened more often if early forms of precancerous polyps or small growths are found. Skin Cancer  Check your skin from head to toe regularly.  Tell your health care provider about   any new moles or changes in moles, especially if there is a change in a mole's shape or color.  Also tell your health care provider if you have a mole that is larger than the size of a pencil eraser.  Always use sunscreen. Apply sunscreen liberally and repeatedly throughout the day.  Protect yourself by wearing long sleeves, pants, a wide-brimmed hat, and sunglasses whenever you are outside. HEART DISEASE, DIABETES, AND HIGH BLOOD PRESSURE   Have your blood pressure checked at least every 1-2 years. High blood pressure causes heart disease and increases the risk of stroke.  If you are between 55 years and 79 years old, ask your health care provider if you should take aspirin to prevent strokes.  Have regular diabetes screenings. This involves taking a blood sample to check your fasting blood sugar level.  If you are at a normal weight and have a low risk for diabetes, have this test once every three years after 42 years of age.  If you are overweight and have a high risk for diabetes, consider being tested at a younger age or more often. PREVENTING INFECTION  Hepatitis B  If  you have a higher risk for hepatitis B, you should be screened for this virus. You are considered at high risk for hepatitis B if:  You were born in a country where hepatitis B is common. Ask your health care provider which countries are considered high risk.  Your parents were born in a high-risk country, and you have not been immunized against hepatitis B (hepatitis B vaccine).  You have HIV or AIDS.  You use needles to inject street drugs.  You live with someone who has hepatitis B.  You have had sex with someone who has hepatitis B.  You get hemodialysis treatment.  You take certain medicines for conditions, including cancer, organ transplantation, and autoimmune conditions. Hepatitis C  Blood testing is recommended for:  Everyone born from 1945 through 1965.  Anyone with known risk factors for hepatitis C. Sexually transmitted infections (STIs)  You should be screened for sexually transmitted infections (STIs) including gonorrhea and chlamydia if:  You are sexually active and are younger than 42 years of age.  You are older than 42 years of age and your health care provider tells you that you are at risk for this type of infection.  Your sexual activity has changed since you were last screened and you are at an increased risk for chlamydia or gonorrhea. Ask your health care provider if you are at risk.  If you do not have HIV, but are at risk, it may be recommended that you take a prescription medicine daily to prevent HIV infection. This is called pre-exposure prophylaxis (PrEP). You are considered at risk if:  You are sexually active and do not regularly use condoms or know the HIV status of your partner(s).  You take drugs by injection.  You are sexually active with a partner who has HIV. Talk with your health care provider about whether you are at high risk of being infected with HIV. If you choose to begin PrEP, you should first be tested for HIV. You should then be  tested every 3 months for as long as you are taking PrEP.  PREGNANCY   If you are premenopausal and you may become pregnant, ask your health care provider about preconception counseling.  If you may become pregnant, take 400 to 800 micrograms (mcg) of folic acid every day.  If you want   to prevent pregnancy, talk to your health care provider about birth control (contraception). OSTEOPOROSIS AND MENOPAUSE   Osteoporosis is a disease in which the bones lose minerals and strength with aging. This can result in serious bone fractures. Your risk for osteoporosis can be identified using a bone density scan.  If you are 65 years of age or older, or if you are at risk for osteoporosis and fractures, ask your health care provider if you should be screened.  Ask your health care provider whether you should take a calcium or vitamin D supplement to lower your risk for osteoporosis.  Menopause may have certain physical symptoms and risks.  Hormone replacement therapy may reduce some of these symptoms and risks. Talk to your health care provider about whether hormone replacement therapy is right for you.  HOME CARE INSTRUCTIONS   Schedule regular health, dental, and eye exams.  Stay current with your immunizations.   Do not use any tobacco products including cigarettes, chewing tobacco, or electronic cigarettes.  If you are pregnant, do not drink alcohol.  If you are breastfeeding, limit how much and how often you drink alcohol.  Limit alcohol intake to no more than 1 drink per day for nonpregnant women. One drink equals 12 ounces of beer, 5 ounces of wine, or 1 ounces of hard liquor.  Do not use street drugs.  Do not share needles.  Ask your health care provider for help if you need support or information about quitting drugs.  Tell your health care provider if you often feel depressed.  Tell your health care provider if you have ever been abused or do not feel safe at home. Document  Released: 04/25/2011 Document Revised: 02/24/2014 Document Reviewed: 09/11/2013 [facility] Patient Information 2015 [place], [place]. This information is not intended to replace advice given to you by your health care provider. Make sure you discuss any questions you have with your health care provider.

## 2015-07-15 NOTE — Progress Notes (Signed)
Pre visit review using our clinic review tool, if applicable. No additional management support is needed unless otherwise documented below in the visit note. 

## 2015-07-15 NOTE — Progress Notes (Signed)
Patient ID: Lorraine Morgan, female    DOB: November 08, 1970  Age: 44 y.o. MRN: [id number]  CC: Annual Exam   HPI Lorraine Morgan presents for annual physical.   1) Health Maintenance-   Diet- No formal   Exercise- Walking occasionally   Immunizations- tdap 2012, flu shot last week  Mammogram- 01/2015  Pap- OB/GYN- pt has past abnormal PAP, hysterectomy  Colonoscopy in 2003 Dr. Servando Snare, repeat by end of year  Eye Exam- 2016   Dental Exam- UTD today  History Lorraine Morgan has a past medical history of History of abnormal Pap smear; History of endometriosis; and GERD (gastroesophageal reflux disease).   Lorraine Morgan has past surgical history that includes Laparoscopic supracervical hysterectomy (07/2010); Tubal ligation (2007); Dilation and curettage of uterus (2005); and Dilation and curettage of uterus (2011).   Her family history includes Cancer in her father, maternal grandfather, and paternal grandmother; Colon polyps in her father; Diabetes in her maternal grandmother; Heart disease in her paternal grandfather; Hypercholesterolemia in her mother.Lorraine Morgan reports that Lorraine Morgan has never smoked. Lorraine Morgan has never used smokeless tobacco. Lorraine Morgan reports that Lorraine Morgan does not drink alcohol or use illicit drugs.  Outpatient Prescriptions Prior to Visit  Medication Sig Dispense Refill  . cetirizine (ZYRTEC) 10 MG tablet Take 10 mg by mouth daily.    . cyclobenzaprine (FLEXERIL) 5 MG tablet Take 5 mg by mouth as needed for muscle spasms.    . hydrocortisone (ANUSOL-HC) 25 MG suppository Place 1 suppository (25 mg total) rectally daily as needed for hemorrhoids. 12 suppository 0  . omeprazole (PRILOSEC) 20 MG capsule Take 20 mg by mouth daily.     No facility-administered medications prior to visit.    ROS Review of Systems  Constitutional: Negative for fever, chills, diaphoresis and fatigue.  HENT: Negative for tinnitus and trouble swallowing.   Eyes: Negative for visual disturbance.  Respiratory: Negative for chest tightness,  shortness of breath and wheezing.   Cardiovascular: Negative for chest pain, palpitations and leg swelling.  Gastrointestinal: Positive for diarrhea. Negative for nausea, vomiting, constipation and blood in stool.       Last week; resolved on its own  Genitourinary: Negative for difficulty urinating.  Musculoskeletal: Negative for back pain and neck pain.  Skin: Negative for rash.  Neurological: Negative for dizziness, weakness, numbness and headaches.  Psychiatric/Behavioral: Negative for suicidal ideas and sleep disturbance. The patient is not nervous/anxious.     Objective:  BP 102/60 mmHg  Pulse 61  Temp(Src) 98.2 F (36.8 C)  Resp 14  Ht 5' (1.524 m)  Wt 139 lb 6.4 oz (63.231 kg)  BMI 27.22 kg/m2  SpO2 98%  LMP 08/10/2010  Physical Exam  Constitutional: Lorraine Morgan is oriented to person, place, and time. Lorraine Morgan appears well-developed and well-nourished. No distress.  HENT:  Head: Normocephalic and atraumatic.  Right Ear: External ear normal.  Left Ear: External ear normal.  Nose: Nose normal.  Mouth/Throat: Oropharynx is clear and moist. No oropharyngeal exudate.  TMs and canals clear on left and blocked by yellow cerumen.   Eyes: Conjunctivae and EOM are normal. Pupils are equal, round, and reactive to light. Right eye exhibits no discharge. Left eye exhibits no discharge. No scleral icterus.  Neck: Normal range of motion. Neck supple. No thyromegaly present.  Cardiovascular: Normal rate, regular rhythm, normal heart sounds and intact distal pulses.  Exam reveals no gallop and no friction rub.   No murmur heard. Pulmonary/Chest: Effort normal and breath sounds normal. No respiratory distress. Lorraine Morgan has no  wheezes. Lorraine Morgan has no rales. Lorraine Morgan exhibits no tenderness.  Abdominal: Soft. Bowel sounds are normal. Lorraine Morgan exhibits no distension and no mass. There is no tenderness. There is no rebound and no guarding.  Musculoskeletal: Normal range of motion. Lorraine Morgan exhibits no edema or tenderness.   Lymphadenopathy:    Lorraine Morgan has no cervical adenopathy.  Neurological: Lorraine Morgan is alert and oriented to person, place, and time. Lorraine Morgan has normal reflexes. No cranial nerve deficit. Lorraine Morgan exhibits normal muscle tone. Coordination normal.  Skin: Skin is warm and dry. No rash noted. Lorraine Morgan is not diaphoretic. No erythema. No pallor.  Psychiatric: Lorraine Morgan has a normal mood and affect. Her behavior is normal. Judgment and thought content normal.    Assessment & Plan:   Asuzena was seen today for annual exam.  Diagnoses and all orders for this visit:  Routine general medical examination at a health care facility -     CBC with Differential/Platelet -     Comprehensive metabolic panel -     Hemoglobin A1c -     Lipid panel -     TSH   I am having Lorraine Morgan maintain her omeprazole, cyclobenzaprine, cetirizine, and hydrocortisone.  No orders of the defined types were placed in this encounter.     Follow-up: Return in about 1 year (around 07/14/2016) for CPE.

## 2015-07-16 LAB — CBC WITH DIFFERENTIAL/PLATELET
Basophils Absolute: 0 10*3/uL (ref 0.0–0.1)
Basophils Relative: 0.6 % (ref 0.0–3.0)
EOS ABS: 0 10*3/uL (ref 0.0–0.7)
EOS PCT: 0.3 % (ref 0.0–5.0)
HCT: 41.4 % (ref 36.0–46.0)
HEMOGLOBIN: 13.8 g/dL (ref 12.0–15.0)
Lymphocytes Relative: 41.1 % (ref 12.0–46.0)
Lymphs Abs: 3.2 10*3/uL (ref 0.7–4.0)
MCHC: 33.3 g/dL (ref 30.0–36.0)
MCV: 90.8 fl (ref 78.0–100.0)
MONO ABS: 0.5 10*3/uL (ref 0.1–1.0)
Monocytes Relative: 6.5 % (ref 3.0–12.0)
Neutro Abs: 4 10*3/uL (ref 1.4–7.7)
Neutrophils Relative %: 51.5 % (ref 43.0–77.0)
Platelets: 201 10*3/uL (ref 150.0–400.0)
RBC: 4.56 Mil/uL (ref 3.87–5.11)
RDW: 13.5 % (ref 11.5–15.5)
WBC: 7.9 10*3/uL (ref 4.0–10.5)

## 2015-07-16 LAB — LIPID PANEL
Cholesterol: 179 mg/dL (ref 0–200)
HDL: 63.1 mg/dL (ref >39.00)
LDL Cholesterol: 105 mg/dL — ABNORMAL HIGH (ref 0–99)
NonHDL: 115.62
Total CHOL/HDL Ratio: 3
Triglycerides: 53 mg/dL (ref 0.0–149.0)
VLDL: 10.6 mg/dL (ref 0.0–40.0)

## 2015-07-16 LAB — COMPREHENSIVE METABOLIC PANEL
ALT: 10 U/L (ref 0–35)
AST: 19 U/L (ref 0–37)
Albumin: 4.3 g/dL (ref 3.5–5.2)
Alkaline Phosphatase: 48 U/L (ref 39–117)
BUN: 10 mg/dL (ref 6–23)
CO2: 27 mEq/L (ref 19–32)
Calcium: 9.7 mg/dL (ref 8.4–10.5)
Chloride: 103 mEq/L (ref 96–112)
Creatinine, Ser: 0.68 mg/dL (ref 0.40–1.20)
GFR: 122.1 mL/min (ref >60.00)
Glucose, Bld: 83 mg/dL (ref 70–99)
Potassium: 3.9 mEq/L (ref 3.5–5.1)
Sodium: 137 mEq/L (ref 135–145)
Total Bilirubin: 0.5 mg/dL (ref 0.2–1.2)
Total Protein: 7.6 g/dL (ref 6.0–8.3)

## 2015-07-16 LAB — HEMOGLOBIN A1C: Hgb A1c MFr Bld: 5.8 % (ref 4.6–6.5)

## 2015-07-16 LAB — TSH: TSH: 0.53 u[IU]/mL (ref 0.35–4.50)

## 2015-09-29 ENCOUNTER — Telehealth: Payer: Self-pay

## 2015-09-29 NOTE — Telephone Encounter (Signed)
-----   Message from Armandina GemmaMichelle L Edmonds sent at 09/23/2015  9:52 AM EST ----- Colon triage

## 2015-09-30 ENCOUNTER — Other Ambulatory Visit: Payer: Self-pay

## 2015-09-30 NOTE — Telephone Encounter (Signed)
Gastroenterology Pre-Procedure Review  Request Date:  Requesting Physician: Dr. Dale Durhamharlene Scott  PATIENT REVIEW QUESTIONS: The patient responded to the following health history questions as indicated:    1. Are you having any GI issues? no 2. Do you have a personal history of Polyps? no 3. Do you have a family history of Colon Cancer or Polyps? yes (Father, polyps, PGM colon cancer and uncles) 4. Diabetes Mellitus? no 5. Joint replacements in the past 12 months?no 6. Major health problems in the past 3 months?no 7. Any artificial heart valves, MVP, or defibrillator?no    MEDICATIONS & ALLERGIES:    Patient reports the following regarding taking any anticoagulation/antiplatelet therapy:   Plavix, Coumadin, Eliquis, Xarelto, Lovenox, Pradaxa, Brilinta, or Effient? No Aspirin? no  Patient confirms/reports the following medications:  Current Outpatient Prescriptions  Medication Sig Dispense Refill  . cetirizine (ZYRTEC) 10 MG tablet Take 10 mg by mouth daily.    . cyclobenzaprine (FLEXERIL) 5 MG tablet Take 5 mg by mouth as needed for muscle spasms.    . hydrocortisone (ANUSOL-HC) 25 MG suppository Place 1 suppository (25 mg total) rectally daily as needed for hemorrhoids. 12 suppository 0  . omeprazole (PRILOSEC) 20 MG capsule Take 20 mg by mouth daily.     No current facility-administered medications for this visit.    Patient confirms/reports the following allergies:  No Known Allergies  No orders of the defined types were placed in this encounter.    AUTHORIZATION INFORMATION Primary Insurance: 1D#: Group #:  Secondary Insurance: 1D#: Group #:  SCHEDULE INFORMATION: Date: 11/09/15 Time: Location: [facility]

## 2015-09-30 NOTE — Telephone Encounter (Signed)
Patient returned your call regarding Colonoscopy. Work # [phone]

## 2015-10-30 ENCOUNTER — Encounter: Payer: Self-pay | Admitting: *Deleted

## 2015-11-06 NOTE — Discharge Instructions (Signed)

## 2015-11-09 ENCOUNTER — Encounter
Admission: RE | Disposition: A | Discharge status: Home / Self Care | Payer: Self-pay | Source: Ambulatory Visit | Attending: Gastroenterology

## 2015-11-09 ENCOUNTER — Ambulatory Visit: Payer: BC Managed Care – PPO | Admitting: Anesthesiology

## 2015-11-09 ENCOUNTER — Ambulatory Visit
Admission: RE | Admit: 2015-11-09 | Discharge: 2015-11-09 | Disposition: A | Discharge status: Home / Self Care | Payer: BC Managed Care – PPO | Source: Ambulatory Visit | Attending: Gastroenterology | Admitting: Gastroenterology

## 2015-11-09 DIAGNOSIS — Z800 Family history of malignant neoplasm of digestive organs: Secondary | ICD-10-CM | POA: Diagnosis not present

## 2015-11-09 DIAGNOSIS — Z8371 Family history of colonic polyps: Secondary | ICD-10-CM | POA: Diagnosis not present

## 2015-11-09 DIAGNOSIS — K641 Second degree hemorrhoids: Secondary | ICD-10-CM | POA: Diagnosis not present

## 2015-11-09 DIAGNOSIS — Z1211 Encounter for screening for malignant neoplasm of colon: Secondary | ICD-10-CM | POA: Insufficient documentation

## 2015-11-09 DIAGNOSIS — Z90710 Acquired absence of both cervix and uterus: Secondary | ICD-10-CM | POA: Diagnosis not present

## 2015-11-09 DIAGNOSIS — Z801 Family history of malignant neoplasm of trachea, bronchus and lung: Secondary | ICD-10-CM | POA: Diagnosis not present

## 2015-11-09 DIAGNOSIS — K219 Gastro-esophageal reflux disease without esophagitis: Secondary | ICD-10-CM | POA: Diagnosis not present

## 2015-11-09 DIAGNOSIS — Z8249 Family history of ischemic heart disease and other diseases of the circulatory system: Secondary | ICD-10-CM | POA: Diagnosis not present

## 2015-11-09 DIAGNOSIS — Z79899 Other long term (current) drug therapy: Secondary | ICD-10-CM | POA: Diagnosis not present

## 2015-11-09 DIAGNOSIS — Z83719 Family history of colon polyps, unspecified: Secondary | ICD-10-CM | POA: Insufficient documentation

## 2015-11-09 DIAGNOSIS — M62830 Muscle spasm of back: Secondary | ICD-10-CM | POA: Insufficient documentation

## 2015-11-09 DIAGNOSIS — Z833 Family history of diabetes mellitus: Secondary | ICD-10-CM | POA: Diagnosis not present

## 2015-11-09 DIAGNOSIS — I499 Cardiac arrhythmia, unspecified: Secondary | ICD-10-CM | POA: Insufficient documentation

## 2015-11-09 HISTORY — DX: Anemia, unspecified: D64.9

## 2015-11-09 HISTORY — DX: Other specified postprocedural states: Z98.890

## 2015-11-09 HISTORY — DX: Nausea with vomiting, unspecified: R11.2

## 2015-11-09 HISTORY — DX: Muscle spasm of back: M62.830

## 2015-11-09 HISTORY — DX: Cardiac arrhythmia, unspecified: I49.9

## 2015-11-09 HISTORY — PX: COLONOSCOPY WITH PROPOFOL: SHX5780

## 2015-11-09 SURGERY — COLONOSCOPY WITH PROPOFOL
Anesthesia: Monitor Anesthesia Care | Wound class: Contaminated

## 2015-11-09 MED ORDER — SODIUM CHLORIDE 0.9 % IV SOLN: INTRAVENOUS | Status: DC

## 2015-11-09 MED ORDER — STERILE WATER FOR IRRIGATION IR SOLN
Status: DC | PRN
  Administered 2015-11-09: 10:00:00

## 2015-11-09 MED ORDER — LIDOCAINE HCL (CARDIAC) 20 MG/ML IV SOLN
INTRAVENOUS | Status: DC | PRN
  Administered 2015-11-09: 50 mg via INTRAVENOUS

## 2015-11-09 MED ORDER — OXYCODONE HCL 5 MG/5ML PO SOLN: 5.0000 mg | Freq: Once | ORAL | Status: DC | PRN

## 2015-11-09 MED ORDER — PROPOFOL 10 MG/ML IV BOLUS
INTRAVENOUS | Status: DC | PRN
  Administered 2015-11-09 (×3): 20 mg via INTRAVENOUS
  Administered 2015-11-09: 50 mg via INTRAVENOUS
  Administered 2015-11-09 (×2): 20 mg via INTRAVENOUS

## 2015-11-09 MED ORDER — LACTATED RINGERS IV SOLN
INTRAVENOUS | Status: DC
  Administered 2015-11-09: 09:00:00 via INTRAVENOUS

## 2015-11-09 MED ORDER — OXYCODONE HCL 5 MG PO TABS: 5.0000 mg | ORAL_TABLET | Freq: Once | ORAL | Status: DC | PRN

## 2015-11-09 SURGICAL SUPPLY — 28 items

## 2015-11-09 NOTE — Transfer of Care (Signed)
Immediate Anesthesia Transfer of Care Note  Patient: Lorraine Morgan  Procedure(s) Performed: Procedure(s): COLONOSCOPY WITH PROPOFOL (N/A)  Patient Location: PACU  Anesthesia Type: MAC  Level of Consciousness: awake, alert  and patient cooperative  Airway and Oxygen Therapy: Patient Spontanous Breathing and Patient connected to supplemental oxygen  Post-op Assessment: Post-op Vital signs reviewed, Patient's Cardiovascular Status Stable, Respiratory Function Stable, Patent Airway and No signs of Nausea or vomiting  Post-op Vital Signs: Reviewed and stable  Complications: No apparent anesthesia complications

## 2015-11-09 NOTE — Anesthesia Procedure Notes (Signed)
Procedure Name: MAC Performed by: Veverly Larimer Pre-anesthesia Checklist: Patient identified, Emergency Drugs available, Suction available, Timeout performed and Patient being monitored Patient Re-evaluated:Patient Re-evaluated prior to inductionOxygen Delivery Method: Nasal cannula Placement Confirmation: positive ETCO2       

## 2015-11-09 NOTE — Anesthesia Preprocedure Evaluation (Signed)
Anesthesia Evaluation  Patient identified by MRN, date of birth, ID band  Reviewed: NPO status   History of Anesthesia Complications (+) PONV and history of anesthetic complications  Airway Mallampati: II  TM Distance: >3 FB Neck ROM: full    Dental no notable dental hx.    Pulmonary neg pulmonary ROS,    Pulmonary exam normal        Cardiovascular Exercise Tolerance: Good Normal cardiovascular exam+ dysrhythmias (occas irreg beat - )      Neuro/Psych negative neurological ROS  negative psych ROS   GI/Hepatic Neg liver ROS, GERD  Medicated,  Endo/Other  negative endocrine ROS  Renal/GU negative Renal ROS  negative genitourinary   Musculoskeletal   Abdominal   Peds  Hematology negative hematology ROS (+)   Anesthesia Other Findings   Reproductive/Obstetrics TAH                              Anesthesia Physical Anesthesia Plan  ASA: II  Anesthesia Plan: MAC   Post-op Pain Management:    Induction:   Airway Management Planned:   Additional Equipment:   Intra-op Plan:   Post-operative Plan:   Informed Consent: I have reviewed the patients History and Physical, chart, labs and discussed the procedure including the risks, benefits and alternatives for the proposed anesthesia with the patient or authorized representative who has indicated his/her understanding and acceptance.     Plan Discussed with: CRNA  Anesthesia Plan Comments:         Anesthesia Quick Evaluation

## 2015-11-09 NOTE — Anesthesia Postprocedure Evaluation (Signed)
Anesthesia Post Note  Patient: Lorraine Morgan  Procedure(s) Performed: Procedure(s) (LRB): COLONOSCOPY WITH PROPOFOL (N/A)  Patient location during evaluation: PACU Anesthesia Type: MAC Level of consciousness: awake and alert Pain management: pain level controlled Vital Signs Assessment: post-procedure vital signs reviewed and stable Respiratory status: spontaneous breathing, nonlabored ventilation, respiratory function stable and patient connected to nasal cannula oxygen Cardiovascular status: stable and blood pressure returned to baseline Anesthetic complications: no    Reganne Messerschmidt
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
## 2015-11-09 NOTE — Op Note (Addendum)
[facility] Patient Name: Lorraine Morgan Procedure Date: 11/09/2015 9:29 AM MRN: [id number] Account #: [id number] Date of Birth: 11/10/1970 Admit Type: Outpatient Age: 45 Room: [facility] OR ROOM 01 Gender: Female Note Status: Supervisor Override Procedure:         Colonoscopy Indications:       Family history of colonic polyps in a first-degree relative Providers:         [name], MD Referring MD:      Dale [place], MD (Referring MD) Medicines:         Propofol per Anesthesia Complications:     No immediate complications. Procedure:         Pre-Anesthesia Assessment:                    - Prior to the procedure, a History and Physical was                     performed, and patient medications and allergies were                     reviewed. The patient's tolerance of previous anesthesia                     was also reviewed. The risks and benefits of the procedure                     and the sedation options and risks were discussed with the                     patient. All questions were answered, and informed consent                     was obtained. Prior Anticoagulants: The patient has taken                     no previous anticoagulant or antiplatelet agents. ASA                     Grade Assessment: II - A patient with mild systemic                     disease. After reviewing the risks and benefits, the                     patient was deemed in satisfactory condition to undergo                     the procedure.                    After obtaining informed consent, the colonoscope was                     passed under direct vision. Throughout the procedure, the                     patient's blood pressure, pulse, and oxygen saturations                     were monitored continuously. The Olympus CF H180AL                     colonoscope (S#: [id number]) was introduced through the anus  and advanced to the the cecum, identified  by appendiceal                     orifice and ileocecal valve. The colonoscopy was performed                     without difficulty. The patient tolerated the procedure                     well. The quality of the bowel preparation was excellent. Findings:      The perianal and digital rectal examinations were normal.      Non-bleeding internal hemorrhoids were found during retroflexion. The       hemorrhoids were Grade II (internal hemorrhoids that prolapse but reduce       spontaneously). Impression:        - Non-bleeding internal hemorrhoids.                    - No specimens collected. Recommendation:    - Repeat colonoscopy in 10 years for screening unless any                     change in family history or lower GI problems. Procedure Code(s): --- Professional ---                    [phone], Colonoscopy, flexible; diagnostic, including                     collection of specimen(s) by brushing or washing, when                     performed (separate procedure) Diagnosis Code(s): --- Professional ---                    Z83.71, Family history of colonic polyps CPT copyright 2014 American Medical Association. All rights reserved. The codes documented in this report are preliminary and upon coder review may  be revised to meet current compliance requirements. Midge Miniumarren Kimiko Common, MD 11/09/2015 9:49:23 AM This report has been signed electronically. Number of Addenda: 0 Note Initiated On: 11/09/2015 9:29 AM Scope Withdrawal Time: 0 hours 6 minutes 21 seconds  Total Procedure Duration: 0 hours 10 minutes 17 seconds       [facility]

## 2015-11-10 ENCOUNTER — Encounter: Payer: Self-pay | Admitting: Gastroenterology

## 2015-11-16 ENCOUNTER — Encounter: Payer: Self-pay | Admitting: Internal Medicine

## 2015-11-17 MED ORDER — OMEPRAZOLE 20 MG PO CPDR: 20.0000 mg | DELAYED_RELEASE_CAPSULE | Freq: Every day | ORAL | Status: DC

## 2015-11-17 NOTE — Telephone Encounter (Signed)
rx sent in for omeprazole #30 with 2 refills.

## 2016-01-25 ENCOUNTER — Encounter: Payer: Self-pay | Admitting: Nurse Practitioner

## 2016-01-25 ENCOUNTER — Ambulatory Visit (INDEPENDENT_AMBULATORY_CARE_PROVIDER_SITE_OTHER): Payer: BC Managed Care – PPO | Admitting: Nurse Practitioner

## 2016-01-25 VITALS — BP 102/64 | HR 67 | Temp 98.2°F | Ht 60.0 in | Wt 143.4 lb

## 2016-01-25 DIAGNOSIS — R079 Chest pain, unspecified: Secondary | ICD-10-CM | POA: Insufficient documentation

## 2016-01-25 MED ORDER — BUSPIRONE HCL 5 MG PO TABS: 5.0000 mg | ORAL_TABLET | Freq: Every day | ORAL | Status: DC
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
## 2016-01-25 NOTE — Assessment & Plan Note (Signed)
Not likely of cardiac etiology  I have personally interpreted her EKG results: NSR, normal rate, normal axis, no acute ST changes, no sign of LVH  Pt is tearful and discussed medications for care. Pt willing to try buspirone 5 mg at night x 4 weeks. FU in 4 weeks.

## 2016-01-25 NOTE — Patient Instructions (Signed)
Try 1 tablet at night for 30 days. Follow up to see if it is helpful.

## 2016-03-02 ENCOUNTER — Encounter: Payer: Self-pay | Admitting: Nurse Practitioner

## 2016-03-02 ENCOUNTER — Ambulatory Visit (INDEPENDENT_AMBULATORY_CARE_PROVIDER_SITE_OTHER): Payer: BC Managed Care – PPO | Admitting: Nurse Practitioner

## 2016-03-02 VITALS — BP 110/68 | HR 68 | Temp 98.3°F | Ht 60.0 in | Wt 142.1 lb

## 2016-03-02 DIAGNOSIS — F411 Generalized anxiety disorder: Secondary | ICD-10-CM | POA: Diagnosis not present

## 2016-03-02 MED ORDER — BUSPIRONE HCL 7.5 MG PO TABS: 7.5000 mg | ORAL_TABLET | Freq: Three times a day (TID) | ORAL | Status: DC

## 2016-03-02 MED ORDER — OMEPRAZOLE 20 MG PO CPDR: 20.0000 mg | DELAYED_RELEASE_CAPSULE | Freq: Every day | ORAL | Status: DC

## 2016-03-02 NOTE — Progress Notes (Signed)
Pre visit review using our clinic review tool, if applicable. No additional management support is needed unless otherwise documented below in the visit note. 

## 2016-03-02 NOTE — Assessment & Plan Note (Signed)
Trying the increased dosage of 7.5 mg up to 3 x a day- uses mainly at night. F/u with Dr. Lorin PicketScott for physical in the future See us if needed
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
## 2016-03-02 NOTE — Patient Instructions (Signed)
Try the new dosage and follow up for your physical in Sept with Dr. Lorin PicketScott.   Call us if you need us!

## 2016-06-15 LAB — HM MAMMOGRAPHY

## 2016-07-21 ENCOUNTER — Ambulatory Visit (INDEPENDENT_AMBULATORY_CARE_PROVIDER_SITE_OTHER): Payer: BC Managed Care – PPO | Admitting: Internal Medicine

## 2016-07-21 ENCOUNTER — Encounter: Payer: Self-pay | Admitting: Internal Medicine

## 2016-07-21 VITALS — BP 110/64 | HR 68 | Temp 97.8°F | Ht 60.05 in | Wt 140.2 lb

## 2016-07-21 DIAGNOSIS — F411 Generalized anxiety disorder: Secondary | ICD-10-CM | POA: Diagnosis not present

## 2016-07-21 DIAGNOSIS — K219 Gastro-esophageal reflux disease without esophagitis: Secondary | ICD-10-CM | POA: Diagnosis not present

## 2016-07-21 DIAGNOSIS — N809 Endometriosis, unspecified: Secondary | ICD-10-CM

## 2016-07-21 DIAGNOSIS — Z8371 Family history of colonic polyps: Secondary | ICD-10-CM

## 2016-07-21 DIAGNOSIS — Z13220 Encounter for screening for lipoid disorders: Secondary | ICD-10-CM | POA: Diagnosis not present

## 2016-07-21 DIAGNOSIS — Z0000 Encounter for general adult medical examination without abnormal findings: Secondary | ICD-10-CM

## 2016-07-21 LAB — CBC WITH DIFFERENTIAL/PLATELET
BASOS PCT: 0.6 % (ref 0.0–3.0)
Basophils Absolute: 0 10*3/uL (ref 0.0–0.1)
EOS ABS: 0 10*3/uL (ref 0.0–0.7)
EOS PCT: 0.8 % (ref 0.0–5.0)
HCT: 38 % (ref 36.0–46.0)
Hemoglobin: 12.9 g/dL (ref 12.0–15.0)
LYMPHS ABS: 2.2 10*3/uL (ref 0.7–4.0)
Lymphocytes Relative: 47.7 % — ABNORMAL HIGH (ref 12.0–46.0)
MCHC: 34 g/dL (ref 30.0–36.0)
MCV: 88.6 fl (ref 78.0–100.0)
MONO ABS: 0.5 10*3/uL (ref 0.1–1.0)
Monocytes Relative: 9.9 % (ref 3.0–12.0)
NEUTROS ABS: 1.9 10*3/uL (ref 1.4–7.7)
Neutrophils Relative %: 41 % — ABNORMAL LOW (ref 43.0–77.0)
PLATELETS: 203 10*3/uL (ref 150.0–400.0)
RBC: 4.29 Mil/uL (ref 3.87–5.11)
RDW: 13.3 % (ref 11.5–15.5)
WBC: 4.6 10*3/uL (ref 4.0–10.5)

## 2016-07-21 LAB — COMPREHENSIVE METABOLIC PANEL
ALBUMIN: 4 g/dL (ref 3.5–5.2)
ALT: 10 U/L (ref 0–35)
AST: 18 U/L (ref 0–37)
Alkaline Phosphatase: 62 U/L (ref 39–117)
BUN: 15 mg/dL (ref 6–23)
CHLORIDE: 104 meq/L (ref 96–112)
CO2: 30 meq/L (ref 19–32)
CREATININE: 0.81 mg/dL (ref 0.40–1.20)
Calcium: 9 mg/dL (ref 8.4–10.5)
GFR: 99.29 mL/min (ref >60.00)
Glucose, Bld: 95 mg/dL (ref 70–99)
POTASSIUM: 4.2 meq/L (ref 3.5–5.1)
SODIUM: 140 meq/L (ref 135–145)
Total Bilirubin: 0.4 mg/dL (ref 0.2–1.2)
Total Protein: 7.3 g/dL (ref 6.0–8.3)

## 2016-07-21 LAB — TSH: TSH: 0.41 u[IU]/mL (ref 0.35–4.50)

## 2016-07-21 LAB — LIPID PANEL
CHOLESTEROL: 191 mg/dL (ref 0–200)
HDL: 66.3 mg/dL (ref >39.00)
LDL Cholesterol: 114 mg/dL — ABNORMAL HIGH (ref 0–99)
NonHDL: 124.81
Total CHOL/HDL Ratio: 3
Triglycerides: 52 mg/dL (ref 0.0–149.0)
VLDL: 10.4 mg/dL (ref 0.0–40.0)

## 2016-07-21 NOTE — Progress Notes (Signed)
Pre visit review using our clinic review tool, if applicable. No additional management support is needed unless otherwise documented below in the visit note. 
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
## 2016-07-22 ENCOUNTER — Encounter: Payer: Self-pay | Admitting: Internal Medicine

## 2016-07-22 MED ORDER — CYCLOBENZAPRINE HCL 5 MG PO TABS: 5.0000 mg | ORAL_TABLET | Freq: Every day | ORAL | 0 refills | Status: DC | PRN

## 2016-07-22 NOTE — Telephone Encounter (Signed)
rx sent in for flexeril.  

## 2016-07-24 ENCOUNTER — Encounter: Payer: Self-pay | Admitting: Internal Medicine

## 2016-07-24 DIAGNOSIS — Z0000 Encounter for general adult medical examination without abnormal findings: Secondary | ICD-10-CM | POA: Insufficient documentation

## 2016-07-24 NOTE — Assessment & Plan Note (Signed)
On buspar.  Doing well with this medication.  Follow.

## 2016-07-24 NOTE — Assessment & Plan Note (Signed)
Had colonoscopy 11/09/15.  Recommended f/u in five years.

## 2016-07-24 NOTE — Assessment & Plan Note (Signed)
Followed by gyn

## 2016-07-24 NOTE — Assessment & Plan Note (Signed)
On omeprazole.  Symptoms controlled.  

## 2016-07-24 NOTE — Assessment & Plan Note (Signed)
Physical today 07/21/16.  PAP through gyn.  Colonoscopy 11/09/15 as outlined.  Mammogram 06/15/16 - Birads II.

## 2016-08-10 ENCOUNTER — Other Ambulatory Visit: Payer: Self-pay | Admitting: Nurse Practitioner

## 2017-07-21 ENCOUNTER — Ambulatory Visit (INDEPENDENT_AMBULATORY_CARE_PROVIDER_SITE_OTHER): Payer: BC Managed Care – PPO | Admitting: Internal Medicine

## 2017-07-21 ENCOUNTER — Encounter: Payer: Self-pay | Admitting: Internal Medicine

## 2017-07-21 VITALS — BP 110/62 | HR 67 | Temp 98.6°F | Resp 14 | Ht 60.0 in | Wt 142.4 lb

## 2017-07-21 DIAGNOSIS — Z13220 Encounter for screening for lipoid disorders: Secondary | ICD-10-CM

## 2017-07-21 DIAGNOSIS — R300 Dysuria: Secondary | ICD-10-CM

## 2017-07-21 DIAGNOSIS — Z8371 Family history of colonic polyps: Secondary | ICD-10-CM | POA: Diagnosis not present

## 2017-07-21 DIAGNOSIS — N3000 Acute cystitis without hematuria: Secondary | ICD-10-CM

## 2017-07-21 DIAGNOSIS — K219 Gastro-esophageal reflux disease without esophagitis: Secondary | ICD-10-CM

## 2017-07-21 DIAGNOSIS — R87619 Unspecified abnormal cytological findings in specimens from cervix uteri: Secondary | ICD-10-CM

## 2017-07-21 DIAGNOSIS — F411 Generalized anxiety disorder: Secondary | ICD-10-CM

## 2017-07-21 DIAGNOSIS — Z0000 Encounter for general adult medical examination without abnormal findings: Secondary | ICD-10-CM | POA: Diagnosis not present

## 2017-07-21 LAB — CBC WITH DIFFERENTIAL/PLATELET
Basophils Absolute: 0 10*3/uL (ref 0.0–0.1)
Basophils Relative: 0.6 % (ref 0.0–3.0)
Eosinophils Absolute: 0 10*3/uL (ref 0.0–0.7)
Eosinophils Relative: 0.5 % (ref 0.0–5.0)
HCT: 39.7 % (ref 36.0–46.0)
Hemoglobin: 12.8 g/dL (ref 12.0–15.0)
LYMPHS ABS: 2.6 10*3/uL (ref 0.7–4.0)
Lymphocytes Relative: 50.5 % — ABNORMAL HIGH (ref 12.0–46.0)
MCHC: 32.3 g/dL (ref 30.0–36.0)
MCV: 93 fl (ref 78.0–100.0)
Monocytes Absolute: 0.5 10*3/uL (ref 0.1–1.0)
Monocytes Relative: 9.8 % (ref 3.0–12.0)
NEUTROS PCT: 38.6 % — AB (ref 43.0–77.0)
Neutro Abs: 2 10*3/uL (ref 1.4–7.7)
Platelets: 204 10*3/uL (ref 150.0–400.0)
RBC: 4.26 Mil/uL (ref 3.87–5.11)
RDW: 13.7 % (ref 11.5–15.5)
WBC: 5.2 10*3/uL (ref 4.0–10.5)

## 2017-07-21 LAB — COMPREHENSIVE METABOLIC PANEL
ALT: 8 U/L (ref 0–35)
AST: 16 U/L (ref 0–37)
Albumin: 4.3 g/dL (ref 3.5–5.2)
Alkaline Phosphatase: 62 U/L (ref 39–117)
BUN: 8 mg/dL (ref 6–23)
CHLORIDE: 99 meq/L (ref 96–112)
CO2: 28 meq/L (ref 19–32)
Calcium: 9.5 mg/dL (ref 8.4–10.5)
Creatinine, Ser: 0.72 mg/dL (ref 0.40–1.20)
GFR: 113.22 mL/min (ref >60.00)
Glucose, Bld: 93 mg/dL (ref 70–99)
POTASSIUM: 3.9 meq/L (ref 3.5–5.1)
SODIUM: 134 meq/L — AB (ref 135–145)
TOTAL PROTEIN: 6.8 g/dL (ref 6.0–8.3)
Total Bilirubin: 0.5 mg/dL (ref 0.2–1.2)

## 2017-07-21 LAB — LIPID PANEL
CHOLESTEROL: 197 mg/dL (ref 0–200)
HDL: 57.4 mg/dL (ref >39.00)
LDL CALC: 126 mg/dL — AB (ref 0–99)
NonHDL: 139.64
TRIGLYCERIDES: 67 mg/dL (ref 0.0–149.0)
Total CHOL/HDL Ratio: 3
VLDL: 13.4 mg/dL (ref 0.0–40.0)

## 2017-07-21 LAB — TSH: TSH: 0.33 u[IU]/mL — ABNORMAL LOW (ref 0.35–4.50)

## 2017-07-21 MED ORDER — BUSPIRONE HCL 7.5 MG PO TABS: 7.5000 mg | ORAL_TABLET | Freq: Three times a day (TID) | ORAL | 1 refills | Status: DC

## 2017-07-21 MED ORDER — OMEPRAZOLE 20 MG PO CPDR: 20.0000 mg | DELAYED_RELEASE_CAPSULE | Freq: Every day | ORAL | 1 refills | Status: DC
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
## 2017-07-22 LAB — URINE CULTURE
MICRO NUMBER: 81078691
SPECIMEN QUALITY:: ADEQUATE

## 2017-07-23 ENCOUNTER — Encounter: Payer: Self-pay | Admitting: Internal Medicine

## 2017-07-23 ENCOUNTER — Other Ambulatory Visit: Payer: Self-pay | Admitting: Internal Medicine

## 2017-07-23 DIAGNOSIS — E871 Hypo-osmolality and hyponatremia: Secondary | ICD-10-CM

## 2017-07-23 DIAGNOSIS — R7989 Other specified abnormal findings of blood chemistry: Secondary | ICD-10-CM

## 2017-07-23 NOTE — Assessment & Plan Note (Signed)
Had colonoscopy 10/2015.  Recommended f/u in 5 years.

## 2017-07-23 NOTE — Assessment & Plan Note (Signed)
Followed by gyn (Dr Chauncey Cruel).  Scheduled for f/u in 08/2017.

## 2017-07-23 NOTE — Assessment & Plan Note (Signed)
Stable on buspar.  Follow.

## 2017-07-23 NOTE — Assessment & Plan Note (Signed)
Physical today.  Pelvic/pap smears through gyn (Dr Chauncey Cruel).  Up to date.  Has follow up scheduled in 08/2017.  Colonoscopy 11/09/15 as outlined.  Gyn will schedule mammogram.  Last 06/15/16 - Birads II.

## 2017-07-23 NOTE — Progress Notes (Signed)
Order placed for f/u labs.  

## 2017-07-23 NOTE — Assessment & Plan Note (Signed)
Controlled on omeprazole.   

## 2017-08-07 ENCOUNTER — Other Ambulatory Visit (INDEPENDENT_AMBULATORY_CARE_PROVIDER_SITE_OTHER): Payer: BC Managed Care – PPO

## 2017-08-07 DIAGNOSIS — R7989 Other specified abnormal findings of blood chemistry: Secondary | ICD-10-CM

## 2017-08-07 DIAGNOSIS — E871 Hypo-osmolality and hyponatremia: Secondary | ICD-10-CM | POA: Diagnosis not present

## 2017-08-08 LAB — SODIUM: Sodium: 138 mEq/L (ref 135–145)

## 2017-08-08 LAB — TSH: TSH: 0.5 u[IU]/mL (ref 0.35–4.50)

## 2017-08-09 ENCOUNTER — Encounter: Payer: Self-pay | Admitting: Internal Medicine
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
## 2017-08-25 ENCOUNTER — Ambulatory Visit (INDEPENDENT_AMBULATORY_CARE_PROVIDER_SITE_OTHER): Payer: BC Managed Care – PPO | Admitting: Obstetrics and Gynecology

## 2017-08-25 ENCOUNTER — Encounter: Payer: Self-pay | Admitting: Obstetrics and Gynecology

## 2017-08-25 VITALS — BP 102/52 | HR 86 | Ht 60.0 in | Wt 145.0 lb

## 2017-08-25 DIAGNOSIS — Z01419 Encounter for gynecological examination (general) (routine) without abnormal findings: Secondary | ICD-10-CM | POA: Diagnosis not present

## 2017-08-25 DIAGNOSIS — Z1239 Encounter for other screening for malignant neoplasm of breast: Secondary | ICD-10-CM

## 2017-08-25 DIAGNOSIS — Z1231 Encounter for screening mammogram for malignant neoplasm of breast: Secondary | ICD-10-CM

## 2017-08-25 NOTE — Patient Instructions (Signed)
Preventive Care 18-39 Years, Female Preventive care refers to lifestyle choices and visits with your health care provider that can promote health and wellness. What does preventive care include?  A yearly physical exam. This is also called an annual well check.  Dental exams once or twice a year.  Routine eye exams. Ask your health care provider how often you should have your eyes checked.  Personal lifestyle choices, including: ? Daily care of your teeth and gums. ? Regular physical activity. ? Eating a healthy diet. ? Avoiding tobacco and drug use. ? Limiting alcohol use. ? Practicing safe sex. ? Taking vitamin and mineral supplements as recommended by your health care provider. What happens during an annual well check? The services and screenings done by your health care provider during your annual well check will depend on your age, overall health, lifestyle risk factors, and family history of disease. Counseling Your health care provider may ask you questions about your:  Alcohol use.  Tobacco use.  Drug use.  Emotional well-being.  Home and relationship well-being.  Sexual activity.  Eating habits.  Work and work Statistician.  Method of birth control.  Menstrual cycle.  Pregnancy history.  Screening You may have the following tests or measurements:  Height, weight, and BMI.  Diabetes screening. This is done by checking your blood sugar (glucose) after you have not eaten for a while (fasting).  Blood pressure.  Lipid and cholesterol levels. These may be checked every 5 years starting at age 66.  Skin check.  Hepatitis C blood test.  Hepatitis B blood test.  Sexually transmitted disease (STD) testing.  BRCA-related cancer screening. This may be done if you have a family history of breast, ovarian, tubal, or peritoneal cancers.  Pelvic exam and Pap test. This may be done every 3 years starting at age 40. Starting at age 59, this may be done every 5  years if you have a Pap test in combination with an HPV test.  Discuss your test results, treatment options, and if necessary, the need for more tests with your health care provider. Vaccines Your health care provider may recommend certain vaccines, such as:  Influenza vaccine. This is recommended every year.  Tetanus, diphtheria, and acellular pertussis (Tdap, Td) vaccine. You may need a Td booster every 10 years.  Varicella vaccine. You may need this if you have not been vaccinated.  HPV vaccine. If you are 69 or younger, you may need three doses over 6 months.  Measles, mumps, and rubella (MMR) vaccine. You may need at least one dose of MMR. You may also need a second dose.  Pneumococcal 13-valent conjugate (PCV13) vaccine. You may need this if you have certain conditions and were not previously vaccinated.  Pneumococcal polysaccharide (PPSV23) vaccine. You may need one or two doses if you smoke cigarettes or if you have certain conditions.  Meningococcal vaccine. One dose is recommended if you are age 27-21 years and a first-year college student living in a residence hall, or if you have one of several medical conditions. You may also need additional booster doses.  Hepatitis A vaccine. You may need this if you have certain conditions or if you travel or work in places where you may be exposed to hepatitis A.  Hepatitis B vaccine. You may need this if you have certain conditions or if you travel or work in places where you may be exposed to hepatitis B.  Haemophilus influenzae type b (Hib) vaccine. You may need this if  you have certain risk factors.  Talk to your health care provider about which screenings and vaccines you need and how often you need them. This information is not intended to replace advice given to you by your health care provider. Make sure you discuss any questions you have with your health care provider. Document Released: 12/06/2001 Document Revised: 06/29/2016  Document Reviewed: 08/11/2015 Elsevier Interactive Patient Education  2017 [employer].

## 2017-11-03 ENCOUNTER — Encounter: Payer: Self-pay | Admitting: Obstetrics and Gynecology

## 2018-02-22 ENCOUNTER — Encounter: Payer: Self-pay | Admitting: Obstetrics and Gynecology

## 2018-02-22 ENCOUNTER — Ambulatory Visit: Payer: BC Managed Care – PPO | Admitting: Obstetrics and Gynecology

## 2018-02-22 VITALS — BP 104/70 | HR 78 | Ht 60.0 in | Wt 150.0 lb

## 2018-02-22 DIAGNOSIS — N6320 Unspecified lump in the left breast, unspecified quadrant: Secondary | ICD-10-CM | POA: Diagnosis not present
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
## 2018-02-22 NOTE — Patient Instructions (Signed)
I value your feedback and entrusting us with your care. If you get a [facility] patient survey, I would appreciate you taking the time to let us know about your experience today. Thank you! 

## 2018-05-29 ENCOUNTER — Telehealth: Payer: Self-pay

## 2018-05-29 NOTE — Telephone Encounter (Signed)
Normally CPE's are scheduled with the PCP, I will forward to DR. Scott patient requesting CPE before November do you have somewhere I can schedule?

## 2018-05-29 NOTE — Telephone Encounter (Signed)
Morrie Sheldonshley, can you schedule cpe for her.  See her note.  (apparently needs after 07/21/18).  I can get with you and work her in somewhere if necessary.  Should have opening before 08/2018.  Thanks

## 2018-05-29 NOTE — Telephone Encounter (Signed)
Copied from CRM [phone]. Topic: Appointment Scheduling - Scheduling Inquiry for Clinic >> May 29, 2018  3:41 PM Maia Pettiesrtiz, Kristie S wrote: Reason for CRM: Pt called to schedule CPE. Last was 07/21/17. Dr. Lorin PicketScott not available until November. Pt asking if CPE can be scheduled with PA or NP. Please advise.

## 2018-05-30 NOTE — Telephone Encounter (Signed)
Called and scheduled pt

## 2018-06-18 ENCOUNTER — Encounter: Payer: Self-pay | Admitting: Obstetrics and Gynecology

## 2018-07-23 ENCOUNTER — Encounter: Payer: Self-pay | Admitting: Internal Medicine

## 2018-07-23 NOTE — Telephone Encounter (Signed)
Patient will eat a light lunch by 12 so she can have her labs done

## 2018-07-24 ENCOUNTER — Ambulatory Visit (INDEPENDENT_AMBULATORY_CARE_PROVIDER_SITE_OTHER): Payer: BC Managed Care – PPO | Admitting: Internal Medicine

## 2018-07-24 ENCOUNTER — Encounter: Payer: Self-pay | Admitting: Internal Medicine

## 2018-07-24 VITALS — BP 102/70 | HR 77 | Temp 97.9°F | Resp 18 | Ht 60.0 in | Wt 147.8 lb

## 2018-07-24 DIAGNOSIS — R928 Other abnormal and inconclusive findings on diagnostic imaging of breast: Secondary | ICD-10-CM

## 2018-07-24 DIAGNOSIS — Z0000 Encounter for general adult medical examination without abnormal findings: Secondary | ICD-10-CM

## 2018-07-24 DIAGNOSIS — R87619 Unspecified abnormal cytological findings in specimens from cervix uteri: Secondary | ICD-10-CM

## 2018-07-24 DIAGNOSIS — Z13220 Encounter for screening for lipoid disorders: Secondary | ICD-10-CM | POA: Diagnosis not present

## 2018-07-24 DIAGNOSIS — N809 Endometriosis, unspecified: Secondary | ICD-10-CM

## 2018-07-24 DIAGNOSIS — F411 Generalized anxiety disorder: Secondary | ICD-10-CM

## 2018-07-24 DIAGNOSIS — K219 Gastro-esophageal reflux disease without esophagitis: Secondary | ICD-10-CM | POA: Diagnosis not present

## 2018-07-24 MED ORDER — OMEPRAZOLE 20 MG PO CPDR: 20.0000 mg | DELAYED_RELEASE_CAPSULE | Freq: Every day | ORAL | 1 refills | Status: DC

## 2018-07-24 MED ORDER — BUSPIRONE HCL 7.5 MG PO TABS: 7.5000 mg | ORAL_TABLET | Freq: Three times a day (TID) | ORAL | 1 refills | Status: DC
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
## 2018-07-25 ENCOUNTER — Other Ambulatory Visit: Payer: Self-pay | Admitting: Internal Medicine

## 2018-07-25 ENCOUNTER — Other Ambulatory Visit (INDEPENDENT_AMBULATORY_CARE_PROVIDER_SITE_OTHER): Payer: BC Managed Care – PPO

## 2018-07-25 DIAGNOSIS — R7989 Other specified abnormal findings of blood chemistry: Secondary | ICD-10-CM

## 2018-07-25 LAB — COMPREHENSIVE METABOLIC PANEL
ALT: 14 U/L (ref 0–35)
AST: 20 U/L (ref 0–37)
Albumin: 4.3 g/dL (ref 3.5–5.2)
Alkaline Phosphatase: 54 U/L (ref 39–117)
BILIRUBIN TOTAL: 0.4 mg/dL (ref 0.2–1.2)
BUN: 10 mg/dL (ref 6–23)
CALCIUM: 9.6 mg/dL (ref 8.4–10.5)
CO2: 28 meq/L (ref 19–32)
CREATININE: 0.88 mg/dL (ref 0.40–1.20)
Chloride: 103 mEq/L (ref 96–112)
GFR: 89.4 mL/min (ref >60.00)
GLUCOSE: 86 mg/dL (ref 70–99)
Potassium: 4.1 mEq/L (ref 3.5–5.1)
Sodium: 138 mEq/L (ref 135–145)
Total Protein: 7.4 g/dL (ref 6.0–8.3)

## 2018-07-25 LAB — CBC WITH DIFFERENTIAL/PLATELET
BASOS ABS: 0.1 10*3/uL (ref 0.0–0.1)
Basophils Relative: 1.1 % (ref 0.0–3.0)
EOS ABS: 0.1 10*3/uL (ref 0.0–0.7)
Eosinophils Relative: 1.1 % (ref 0.0–5.0)
HEMATOCRIT: 38.1 % (ref 36.0–46.0)
Hemoglobin: 12.7 g/dL (ref 12.0–15.0)
LYMPHS PCT: 50 % — AB (ref 12.0–46.0)
Lymphs Abs: 3.5 10*3/uL (ref 0.7–4.0)
MCHC: 33.2 g/dL (ref 30.0–36.0)
MCV: 89.2 fl (ref 78.0–100.0)
Monocytes Absolute: 0.6 10*3/uL (ref 0.1–1.0)
Monocytes Relative: 9.2 % (ref 3.0–12.0)
NEUTROS ABS: 2.7 10*3/uL (ref 1.4–7.7)
Neutrophils Relative %: 38.6 % — ABNORMAL LOW (ref 43.0–77.0)
PLATELETS: 218 10*3/uL (ref 150.0–400.0)
RBC: 4.27 Mil/uL (ref 3.87–5.11)
RDW: 13.1 % (ref 11.5–15.5)
WBC: 6.9 10*3/uL (ref 4.0–10.5)

## 2018-07-25 LAB — LIPID PANEL
CHOL/HDL RATIO: 3
CHOLESTEROL: 188 mg/dL (ref 0–200)
HDL: 58 mg/dL (ref >39.00)
LDL CALC: 110 mg/dL — AB (ref 0–99)
NONHDL: 130.15
Triglycerides: 101 mg/dL (ref 0.0–149.0)
VLDL: 20.2 mg/dL (ref 0.0–40.0)

## 2018-07-25 LAB — T4, FREE: Free T4: 0.88 ng/dL (ref 0.60–1.60)

## 2018-07-25 LAB — TSH: TSH: 0.29 u[IU]/mL — ABNORMAL LOW (ref 0.35–4.50)

## 2018-07-25 LAB — T3, FREE: T3 FREE: 3.2 pg/mL (ref 2.3–4.2)

## 2018-07-25 NOTE — Progress Notes (Signed)
Order placed for add on labs.   °

## 2018-07-27 ENCOUNTER — Encounter: Payer: Self-pay | Admitting: *Deleted

## 2018-07-29 ENCOUNTER — Encounter: Payer: Self-pay | Admitting: Internal Medicine

## 2018-07-29 NOTE — Assessment & Plan Note (Signed)
Stable on buspar.  Follow.   

## 2018-07-29 NOTE — Assessment & Plan Note (Signed)
Controlled on omeprazole.   

## 2018-07-29 NOTE — Assessment & Plan Note (Signed)
Had mammogram 11/03/17.  Recommended f/u left breast mammogram - performed 11/20/17 - birads III.  Recommended f/u in 6 months.  She plans to schedule.

## 2018-07-29 NOTE — Assessment & Plan Note (Signed)
Followed by gyn

## 2018-07-29 NOTE — Assessment & Plan Note (Signed)
Physical today 07/26/18.  Pelvic/pap smears through gyn.  Breast exam through there as well.  Mammogram as outlined.  Colonoscopy 10/2015.  Recommended f/u in 5 years.

## 2018-07-29 NOTE — Assessment & Plan Note (Signed)
Followed by gyn - Dr Chauncey Cruel.  Sees gyn.  Up to date.

## 2018-09-03 ENCOUNTER — Other Ambulatory Visit (INDEPENDENT_AMBULATORY_CARE_PROVIDER_SITE_OTHER): Payer: 59

## 2018-09-03 ENCOUNTER — Other Ambulatory Visit (HOSPITAL_COMMUNITY)
Admission: RE | Admit: 2018-09-03 | Discharge: 2018-09-03 | Disposition: A | Discharge status: Home / Self Care | Payer: 59 | Source: Ambulatory Visit | Attending: Obstetrics and Gynecology | Admitting: Obstetrics and Gynecology

## 2018-09-03 ENCOUNTER — Encounter: Payer: Self-pay | Admitting: Obstetrics and Gynecology

## 2018-09-03 ENCOUNTER — Ambulatory Visit (INDEPENDENT_AMBULATORY_CARE_PROVIDER_SITE_OTHER): Payer: BC Managed Care – PPO | Admitting: Obstetrics and Gynecology

## 2018-09-03 VITALS — BP 100/60 | HR 66 | Wt 151.0 lb

## 2018-09-03 DIAGNOSIS — Z01419 Encounter for gynecological examination (general) (routine) without abnormal findings: Secondary | ICD-10-CM

## 2018-09-03 DIAGNOSIS — R7989 Other specified abnormal findings of blood chemistry: Secondary | ICD-10-CM

## 2018-09-03 DIAGNOSIS — Z1239 Encounter for other screening for malignant neoplasm of breast: Secondary | ICD-10-CM

## 2018-09-03 DIAGNOSIS — Z124 Encounter for screening for malignant neoplasm of cervix: Secondary | ICD-10-CM | POA: Insufficient documentation

## 2018-09-03 LAB — T3, FREE: T3 FREE: 3.1 pg/mL (ref 2.3–4.2)

## 2018-09-03 LAB — T4, FREE: FREE T4: 0.68 ng/dL (ref 0.60–1.60)

## 2018-09-03 LAB — TSH: TSH: 0.45 u[IU]/mL (ref 0.35–4.50)

## 2018-09-03 LAB — HM PAP SMEAR: HM Pap smear: NEGATIVE
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
## 2018-09-04 ENCOUNTER — Encounter: Payer: Self-pay | Admitting: Internal Medicine

## 2018-09-05 LAB — CYTOLOGY - PAP
Diagnosis: NEGATIVE
HPV: NOT DETECTED

## 2018-09-06 ENCOUNTER — Other Ambulatory Visit: Payer: BC Managed Care – PPO

## 2018-10-10 ENCOUNTER — Telehealth: Payer: Self-pay

## 2018-10-10 NOTE — Telephone Encounter (Signed)
Advise

## 2018-10-10 NOTE — Telephone Encounter (Signed)
[facility].  Needs order.  [phone] or p 4:30 call cell [phone]

## 2018-10-12 ENCOUNTER — Other Ambulatory Visit: Payer: Self-pay | Admitting: Internal Medicine

## 2018-10-12 ENCOUNTER — Other Ambulatory Visit: Payer: Self-pay | Admitting: Obstetrics and Gynecology

## 2018-10-12 DIAGNOSIS — R921 Mammographic calcification found on diagnostic imaging of breast: Secondary | ICD-10-CM

## 2018-10-18 ENCOUNTER — Other Ambulatory Visit: Payer: Self-pay | Admitting: Obstetrics and Gynecology

## 2018-10-18 DIAGNOSIS — R921 Mammographic calcification found on diagnostic imaging of breast: Secondary | ICD-10-CM

## 2018-10-18 DIAGNOSIS — R928 Other abnormal and inconclusive findings on diagnostic imaging of breast: Secondary | ICD-10-CM

## 2018-10-18 NOTE — Telephone Encounter (Signed)
Order is in.

## 2018-11-12 ENCOUNTER — Ambulatory Visit
Admission: RE | Admit: 2018-11-12 | Discharge: 2018-11-12 | Disposition: A | Discharge status: Home / Self Care | Payer: BC Managed Care – PPO | Source: Ambulatory Visit | Attending: Obstetrics and Gynecology | Admitting: Obstetrics and Gynecology

## 2018-11-12 ENCOUNTER — Encounter: Payer: Self-pay | Admitting: Internal Medicine

## 2018-11-12 DIAGNOSIS — R921 Mammographic calcification found on diagnostic imaging of breast: Secondary | ICD-10-CM

## 2018-11-12 NOTE — Telephone Encounter (Signed)
I can see her at 12:30 on 11/21/18.  See if she can come in at this time and add her to the schedule.  Thanks.

## 2018-11-12 NOTE — Telephone Encounter (Signed)
Confirmed nothing acute going on with patient. She had stomach bug about 4 weeks ago and after that she normally has increased acid reflux. Patient thought that it would get better but still has not. She is still having increased acid reflux, some nausea and stomach discomfort. She is ok to wait until next week to be seen. Was not sure if you were ok to do work in next Wednesday. Advised that you were only in the office half of the day that day but could work her in over the next week if not.

## 2018-11-21 ENCOUNTER — Encounter: Payer: Self-pay | Admitting: Internal Medicine

## 2018-11-21 ENCOUNTER — Ambulatory Visit (INDEPENDENT_AMBULATORY_CARE_PROVIDER_SITE_OTHER): Payer: 59 | Admitting: Internal Medicine

## 2018-11-21 DIAGNOSIS — R1013 Epigastric pain: Secondary | ICD-10-CM | POA: Diagnosis not present

## 2018-11-21 DIAGNOSIS — K219 Gastro-esophageal reflux disease without esophagitis: Secondary | ICD-10-CM

## 2018-11-21 DIAGNOSIS — R109 Unspecified abdominal pain: Secondary | ICD-10-CM

## 2018-11-21 LAB — CBC WITH DIFFERENTIAL/PLATELET
BASOS ABS: 0 10*3/uL (ref 0.0–0.1)
Basophils Relative: 0.4 % (ref 0.0–3.0)
Eosinophils Absolute: 0 10*3/uL (ref 0.0–0.7)
Eosinophils Relative: 0.7 % (ref 0.0–5.0)
HCT: 40.5 % (ref 36.0–46.0)
Hemoglobin: 13.5 g/dL (ref 12.0–15.0)
Lymphocytes Relative: 39.8 % (ref 12.0–46.0)
Lymphs Abs: 2.2 10*3/uL (ref 0.7–4.0)
MCHC: 33.4 g/dL (ref 30.0–36.0)
MCV: 90.7 fl (ref 78.0–100.0)
MONO ABS: 0.6 10*3/uL (ref 0.1–1.0)
Monocytes Relative: 10.4 % (ref 3.0–12.0)
Neutro Abs: 2.6 10*3/uL (ref 1.4–7.7)
Neutrophils Relative %: 48.7 % (ref 43.0–77.0)
Platelets: 213 10*3/uL (ref 150.0–400.0)
RBC: 4.47 Mil/uL (ref 3.87–5.11)
RDW: 13.1 % (ref 11.5–15.5)
WBC: 5.4 10*3/uL (ref 4.0–10.5)

## 2018-11-21 LAB — BASIC METABOLIC PANEL
BUN: 11 mg/dL (ref 6–23)
CO2: 29 mEq/L (ref 19–32)
Calcium: 9.3 mg/dL (ref 8.4–10.5)
Chloride: 104 mEq/L (ref 96–112)
Creatinine, Ser: 0.7 mg/dL (ref 0.40–1.20)
GFR: 109.37 mL/min (ref >60.00)
Glucose, Bld: 88 mg/dL (ref 70–99)
Potassium: 4.5 mEq/L (ref 3.5–5.1)
Sodium: 138 mEq/L (ref 135–145)

## 2018-11-21 LAB — LIPASE: Lipase: 29 U/L (ref 11.0–59.0)

## 2018-11-21 LAB — HEPATIC FUNCTION PANEL
ALK PHOS: 62 U/L (ref 39–117)
ALT: 13 U/L (ref 0–35)
AST: 19 U/L (ref 0–37)
Albumin: 4.4 g/dL (ref 3.5–5.2)
Bilirubin, Direct: 0.1 mg/dL (ref 0.0–0.3)
Total Bilirubin: 0.3 mg/dL (ref 0.2–1.2)
Total Protein: 7.3 g/dL (ref 6.0–8.3)

## 2018-11-21 LAB — AMYLASE: Amylase: 65 U/L (ref 27–131)

## 2018-11-21 MED ORDER — OMEPRAZOLE 20 MG PO CPDR: 20.0000 mg | DELAYED_RELEASE_CAPSULE | Freq: Every day | ORAL | 1 refills | Status: DC

## 2018-11-21 NOTE — Patient Instructions (Signed)
Take pepcid 20mg  30 minutes before breakfast  Take prilosec (omeprazole) 30 minutes before your evening meal.
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
## 2018-11-22 ENCOUNTER — Ambulatory Visit: Payer: 59

## 2018-11-24 ENCOUNTER — Encounter: Payer: Self-pay | Admitting: Internal Medicine

## 2018-11-24 NOTE — Assessment & Plan Note (Addendum)
Abdominal pain (epigastric pain) as outlined.  Also with associated nausea and decreased appetite.  Persistent.  Check labs including cbc, metabolic panel, amylase, lipase and liver function tests.  Also check abdominal ultrasound.  Continue prilosec daily in the evening and pepcid in the am.

## 2018-11-24 NOTE — Assessment & Plan Note (Signed)
On prilosec.  Continue.   

## 2018-11-28 ENCOUNTER — Encounter: Payer: Self-pay | Admitting: Internal Medicine

## 2018-11-28 NOTE — Telephone Encounter (Signed)
I gave the results of CT to you today. I can call pt if needed

## 2019-01-25 ENCOUNTER — Encounter: Payer: Self-pay | Admitting: Internal Medicine

## 2019-01-29 ENCOUNTER — Ambulatory Visit (INDEPENDENT_AMBULATORY_CARE_PROVIDER_SITE_OTHER): Payer: 59 | Admitting: Internal Medicine

## 2019-01-29 ENCOUNTER — Encounter: Payer: Self-pay | Admitting: Internal Medicine

## 2019-01-29 DIAGNOSIS — G479 Sleep disorder, unspecified: Secondary | ICD-10-CM | POA: Diagnosis not present

## 2019-01-29 DIAGNOSIS — K219 Gastro-esophageal reflux disease without esophagitis: Secondary | ICD-10-CM | POA: Diagnosis not present

## 2019-01-29 DIAGNOSIS — F411 Generalized anxiety disorder: Secondary | ICD-10-CM

## 2019-01-29 DIAGNOSIS — R1013 Epigastric pain: Secondary | ICD-10-CM

## 2019-01-29 MED ORDER — TRAZODONE HCL 50 MG PO TABS: 25.0000 mg | ORAL_TABLET | Freq: Every evening | ORAL | 1 refills | Status: DC | PRN

## 2019-01-29 NOTE — Assessment & Plan Note (Signed)
On buspar.  Has been doing well.  Trouble sleeping now.  Trouble staying asleep.  Discussed behavioral modification.  She has tried melatonin and buspar.  Discussed treatment options.  Trial of trazodone as outlined.

## 2019-01-29 NOTE — Assessment & Plan Note (Signed)
Resolved.  Abdominal ultrasound unrevealing.  On prilosec and pepcid.  Continue.  Follow.

## 2019-01-29 NOTE — Assessment & Plan Note (Signed)
On prilosec and pepcid and doing well.  Follow.

## 2019-01-29 NOTE — Assessment & Plan Note (Signed)
Having trouble staying asleep.  Discussed behavioral modification.  Has tried melatonin and buspar.  Persistent and now occurring nightly.  Need to break the cycle of her not sleeping.  Trial of trazodone as directed.  Follow.  Call with update.
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
## 2019-02-18 ENCOUNTER — Encounter: Payer: Self-pay | Admitting: Internal Medicine

## 2019-05-23 ENCOUNTER — Other Ambulatory Visit: Payer: Self-pay

## 2019-05-27 ENCOUNTER — Encounter: Payer: Self-pay | Admitting: Internal Medicine

## 2019-05-27 ENCOUNTER — Ambulatory Visit (INDEPENDENT_AMBULATORY_CARE_PROVIDER_SITE_OTHER): Payer: BC Managed Care – PPO | Admitting: Internal Medicine

## 2019-05-27 ENCOUNTER — Other Ambulatory Visit: Payer: Self-pay

## 2019-05-27 DIAGNOSIS — K219 Gastro-esophageal reflux disease without esophagitis: Secondary | ICD-10-CM

## 2019-05-27 DIAGNOSIS — F411 Generalized anxiety disorder: Secondary | ICD-10-CM

## 2019-05-27 DIAGNOSIS — G479 Sleep disorder, unspecified: Secondary | ICD-10-CM | POA: Diagnosis not present

## 2019-05-27 MED ORDER — BUSPIRONE HCL 7.5 MG PO TABS: 7.5000 mg | ORAL_TABLET | Freq: Two times a day (BID) | ORAL | 3 refills | Status: DC | PRN

## 2019-05-27 MED ORDER — OMEPRAZOLE 20 MG PO CPDR: 20.0000 mg | DELAYED_RELEASE_CAPSULE | Freq: Every day | ORAL | 1 refills | Status: DC

## 2019-05-27 MED ORDER — TRAZODONE HCL 50 MG PO TABS: 25.0000 mg | ORAL_TABLET | Freq: Every evening | ORAL | 2 refills | Status: DC | PRN
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
## 2019-06-01 ENCOUNTER — Encounter: Payer: Self-pay | Admitting: Internal Medicine

## 2019-06-01 NOTE — Assessment & Plan Note (Signed)
Controlled.  

## 2019-06-01 NOTE — Assessment & Plan Note (Signed)
Doing well on trazodone.  Continue.  Follow.

## 2019-06-01 NOTE — Assessment & Plan Note (Signed)
On buspar.  Doing well on this medication.  Does not feel needs any change.  Follow.

## 2019-08-15 ENCOUNTER — Encounter: Payer: Self-pay | Admitting: Internal Medicine

## 2019-08-15 ENCOUNTER — Other Ambulatory Visit: Payer: Self-pay

## 2019-08-15 ENCOUNTER — Ambulatory Visit: Payer: BC Managed Care – PPO | Admitting: Internal Medicine

## 2019-08-15 DIAGNOSIS — Z13220 Encounter for screening for lipoid disorders: Secondary | ICD-10-CM

## 2019-08-15 DIAGNOSIS — K219 Gastro-esophageal reflux disease without esophagitis: Secondary | ICD-10-CM

## 2019-08-15 DIAGNOSIS — R079 Chest pain, unspecified: Secondary | ICD-10-CM

## 2019-08-15 DIAGNOSIS — R1013 Epigastric pain: Secondary | ICD-10-CM | POA: Diagnosis not present

## 2019-08-15 DIAGNOSIS — R7989 Other specified abnormal findings of blood chemistry: Secondary | ICD-10-CM | POA: Diagnosis not present

## 2019-08-15 MED ORDER — PANTOPRAZOLE SODIUM 40 MG PO TBEC: 40.0000 mg | DELAYED_RELEASE_TABLET | Freq: Two times a day (BID) | ORAL | 2 refills | Status: DC
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
## 2019-08-16 ENCOUNTER — Other Ambulatory Visit (INDEPENDENT_AMBULATORY_CARE_PROVIDER_SITE_OTHER): Payer: BC Managed Care – PPO

## 2019-08-16 DIAGNOSIS — R7989 Other specified abnormal findings of blood chemistry: Secondary | ICD-10-CM | POA: Diagnosis not present

## 2019-08-16 DIAGNOSIS — R946 Abnormal results of thyroid function studies: Secondary | ICD-10-CM | POA: Diagnosis not present

## 2019-08-16 LAB — LIPID PANEL
Cholesterol: 228 mg/dL — ABNORMAL HIGH (ref 0–200)
HDL: 67.2 mg/dL (ref >39.00)
LDL Cholesterol: 144 mg/dL — ABNORMAL HIGH (ref 0–99)
NonHDL: 160.34
Total CHOL/HDL Ratio: 3
Triglycerides: 84 mg/dL (ref 0.0–149.0)
VLDL: 16.8 mg/dL (ref 0.0–40.0)

## 2019-08-16 LAB — HEPATIC FUNCTION PANEL
ALT: 12 U/L (ref 0–35)
AST: 18 U/L (ref 0–37)
Albumin: 4.7 g/dL (ref 3.5–5.2)
Alkaline Phosphatase: 68 U/L (ref 39–117)
Bilirubin, Direct: 0.1 mg/dL (ref 0.0–0.3)
Total Bilirubin: 0.5 mg/dL (ref 0.2–1.2)
Total Protein: 7.5 g/dL (ref 6.0–8.3)

## 2019-08-16 LAB — CBC WITH DIFFERENTIAL/PLATELET
Basophils Absolute: 0.1 10*3/uL (ref 0.0–0.1)
Basophils Relative: 1.4 % (ref 0.0–3.0)
Eosinophils Absolute: 0 10*3/uL (ref 0.0–0.7)
Eosinophils Relative: 0.9 % (ref 0.0–5.0)
HCT: 39.5 % (ref 36.0–46.0)
Hemoglobin: 13 g/dL (ref 12.0–15.0)
Lymphocytes Relative: 51.9 % — ABNORMAL HIGH (ref 12.0–46.0)
Lymphs Abs: 2.8 10*3/uL (ref 0.7–4.0)
MCHC: 33 g/dL (ref 30.0–36.0)
MCV: 91.4 fl (ref 78.0–100.0)
Monocytes Absolute: 0.4 10*3/uL (ref 0.1–1.0)
Monocytes Relative: 8.2 % (ref 3.0–12.0)
Neutro Abs: 2 10*3/uL (ref 1.4–7.7)
Neutrophils Relative %: 37.6 % — ABNORMAL LOW (ref 43.0–77.0)
Platelets: 184 10*3/uL (ref 150.0–400.0)
RBC: 4.32 Mil/uL (ref 3.87–5.11)
RDW: 13.7 % (ref 11.5–15.5)
WBC: 5.4 10*3/uL (ref 4.0–10.5)

## 2019-08-16 LAB — BASIC METABOLIC PANEL
BUN: 11 mg/dL (ref 6–23)
CO2: 29 mEq/L (ref 19–32)
Calcium: 9.8 mg/dL (ref 8.4–10.5)
Chloride: 101 mEq/L (ref 96–112)
Creatinine, Ser: 0.78 mg/dL (ref 0.40–1.20)
GFR: 96.22 mL/min (ref >60.00)
Glucose, Bld: 88 mg/dL (ref 70–99)
Potassium: 4.1 mEq/L (ref 3.5–5.1)
Sodium: 138 mEq/L (ref 135–145)

## 2019-08-16 LAB — TSH: TSH: 0.24 u[IU]/mL — ABNORMAL LOW (ref 0.35–4.50)

## 2019-08-16 LAB — LIPASE: Lipase: 29 U/L (ref 11.0–59.0)

## 2019-08-16 LAB — T4, FREE: Free T4: 0.78 ng/dL (ref 0.60–1.60)

## 2019-08-16 LAB — AMYLASE: Amylase: 64 U/L (ref 27–131)

## 2019-08-16 LAB — T3, FREE: T3, Free: 3.1 pg/mL (ref 2.3–4.2)

## 2019-08-17 ENCOUNTER — Encounter: Payer: Self-pay | Admitting: Internal Medicine

## 2019-08-17 DIAGNOSIS — R079 Chest pain, unspecified: Secondary | ICD-10-CM | POA: Insufficient documentation

## 2019-08-17 NOTE — Assessment & Plan Note (Signed)
Recently evaluated at [facility].  EKG -per report unremarkable.  Felt to be of GI origin.  No pain with increased activity or exertion.  Pain as outlined and not noticing increased acid reflux.  Some epigastric pain with palpation.  Hold on further cardiac w/up.  Start protonix as outlined.

## 2019-08-17 NOTE — Assessment & Plan Note (Addendum)
Previous abdominal ultrasound unrevealing (11/2018).  Pain in epigastric region with palpation - on exam.  Start protonix as outlined.  Check cbc, liver panel, amylase and lipase.  Refer to GI as outlined

## 2019-08-17 NOTE — Assessment & Plan Note (Signed)
Previous EGD 08/2011 - gastritis.  Recently evaluated for chest pain.  Recently started noticing acid reflux.  Start protonix bid.  Also describes some discomfort and question of dysphagia as outlined.  Refer to GI for further evaluation and question of need for EGD.

## 2019-09-03 ENCOUNTER — Encounter: Payer: Self-pay | Admitting: Internal Medicine

## 2019-09-05 NOTE — Telephone Encounter (Signed)
Seen for f/u in August. Recommended f/u in 09/2019. Pt was seen for work in on 10/22. Has appt with GI the same day she is supposed to see you (09/26/19). Are you ok to move her f/u out?

## 2019-09-05 NOTE — Telephone Encounter (Signed)
I am ok to move out the appt.  Can reschedule for the next few months.

## 2019-09-06 NOTE — Telephone Encounter (Signed)
LMTCB

## 2019-09-26 ENCOUNTER — Other Ambulatory Visit: Payer: Self-pay

## 2019-09-26 ENCOUNTER — Ambulatory Visit: Payer: BC Managed Care – PPO | Admitting: Gastroenterology

## 2019-09-26 ENCOUNTER — Ambulatory Visit (INDEPENDENT_AMBULATORY_CARE_PROVIDER_SITE_OTHER): Payer: BC Managed Care – PPO | Admitting: Obstetrics and Gynecology

## 2019-09-26 ENCOUNTER — Other Ambulatory Visit: Payer: Self-pay | Admitting: Obstetrics and Gynecology

## 2019-09-26 ENCOUNTER — Ambulatory Visit: Payer: BC Managed Care – PPO | Admitting: Internal Medicine

## 2019-09-26 ENCOUNTER — Ambulatory Visit: Payer: BC Managed Care – PPO | Admitting: Obstetrics and Gynecology

## 2019-09-26 VITALS — BP 124/78 | Ht 60.0 in | Wt 152.0 lb

## 2019-09-26 VITALS — BP 114/68 | HR 84 | Temp 98.1°F | Ht 60.0 in | Wt 152.2 lb

## 2019-09-26 DIAGNOSIS — N951 Menopausal and female climacteric states: Secondary | ICD-10-CM

## 2019-09-26 DIAGNOSIS — Z1239 Encounter for other screening for malignant neoplasm of breast: Secondary | ICD-10-CM

## 2019-09-26 DIAGNOSIS — R110 Nausea: Secondary | ICD-10-CM | POA: Diagnosis not present

## 2019-09-26 DIAGNOSIS — R1013 Epigastric pain: Secondary | ICD-10-CM | POA: Diagnosis not present

## 2019-09-26 DIAGNOSIS — Z01419 Encounter for gynecological examination (general) (routine) without abnormal findings: Secondary | ICD-10-CM

## 2019-09-26 DIAGNOSIS — Z1329 Encounter for screening for other suspected endocrine disorder: Secondary | ICD-10-CM

## 2019-09-26 DIAGNOSIS — K219 Gastro-esophageal reflux disease without esophagitis: Secondary | ICD-10-CM

## 2019-09-26 DIAGNOSIS — Z1231 Encounter for screening mammogram for malignant neoplasm of breast: Secondary | ICD-10-CM

## 2019-09-26 DIAGNOSIS — R1011 Right upper quadrant pain: Secondary | ICD-10-CM

## 2019-09-26 NOTE — Patient Instructions (Signed)
[facility] [address] [place] [address]  [facility] [place]  [address] [place] [address]  Phone: [phone]  
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
## 2019-09-27 ENCOUNTER — Other Ambulatory Visit: Payer: Self-pay

## 2019-09-27 DIAGNOSIS — K219 Gastro-esophageal reflux disease without esophagitis: Secondary | ICD-10-CM

## 2019-09-27 LAB — FOLLICLE STIMULATING HORMONE: FSH: 111 m[IU]/mL

## 2019-09-27 LAB — ESTRADIOL: Estradiol: <5 pg/mL

## 2019-09-27 LAB — TSH: TSH: 0.243 u[IU]/mL — ABNORMAL LOW (ref 0.450–4.500)

## 2019-09-30 ENCOUNTER — Other Ambulatory Visit: Payer: Self-pay | Admitting: Obstetrics and Gynecology

## 2019-09-30 DIAGNOSIS — R7989 Other specified abnormal findings of blood chemistry: Secondary | ICD-10-CM

## 2019-09-30 NOTE — Progress Notes (Signed)
Needs lab to check full thyroid panel, not time sensitive, order is in

## 2019-10-01 ENCOUNTER — Other Ambulatory Visit: Payer: BC Managed Care – PPO

## 2019-10-01 NOTE — Telephone Encounter (Signed)
-----   Message from Malachy Mood, MD sent at 09/30/2019  5:51 PM EST ----- Needs lab to check full thyroid panel, not time sensitive, order is in

## 2019-10-01 NOTE — Telephone Encounter (Signed)
Called to schedule lab appointment. Patient would like to have them released so she can go to an outpatient facility. Please advise

## 2019-10-03 ENCOUNTER — Other Ambulatory Visit: Payer: BC Managed Care – PPO

## 2019-10-03 ENCOUNTER — Other Ambulatory Visit: Payer: Self-pay

## 2019-10-03 NOTE — Telephone Encounter (Signed)
Pt calling; went to [facility] at [address] to have drawn this am; they did not have the orders available to them; pt spoke to someone earlier this week who said they would be released.  Pt would like to have them drawn at lunch today.  W# [phone]  If calling at lunch c# [phone]

## 2019-10-03 NOTE — Telephone Encounter (Signed)
Per Richrd Prime phlebotomist, requisition faxed to [facility] on [address].  Pt aware and plans to go at lunch today to have drawn.

## 2019-10-04 LAB — THYROID PANEL WITH TSH
Free Thyroxine Index: 1.9 (ref 1.2–4.9)
T3 Uptake Ratio: 29 % (ref 24–39)
T4, Total: 6.5 ug/dL (ref 4.5–12.0)
TSH: 0.415 u[IU]/mL — ABNORMAL LOW (ref 0.450–4.500)

## 2019-10-23 ENCOUNTER — Other Ambulatory Visit: Payer: Self-pay

## 2019-10-24 ENCOUNTER — Other Ambulatory Visit: Payer: Self-pay

## 2019-10-24 ENCOUNTER — Ambulatory Visit
Admission: RE | Admit: 2019-10-24 | Discharge: 2019-10-24 | Disposition: A | Discharge status: Home / Self Care | Payer: BC Managed Care – PPO | Source: Ambulatory Visit | Attending: Gastroenterology | Admitting: Gastroenterology

## 2019-10-24 DIAGNOSIS — R1011 Right upper quadrant pain: Secondary | ICD-10-CM

## 2019-10-24 MED ORDER — TECHNETIUM TC 99M MEBROFENIN IV KIT
5.0000 | PACK | Freq: Once | INTRAVENOUS | Status: AC | PRN
  Administered 2019-10-24: 10:00:00 5.456 via INTRAVENOUS

## 2019-10-30 ENCOUNTER — Telehealth: Payer: Self-pay

## 2019-10-30 NOTE — Telephone Encounter (Signed)
Mychart message sent to pt regarding her HIDA scan results.

## 2019-10-30 NOTE — Telephone Encounter (Signed)
-----   Message from [name], MD sent at 10/27/2019  9:33 AM EST ----- Let the patient know that the gall bladder emptying study showed that her gall bladder is working well.

## 2019-11-05 ENCOUNTER — Encounter: Payer: Self-pay | Admitting: Internal Medicine

## 2019-11-06 ENCOUNTER — Other Ambulatory Visit: Payer: Self-pay

## 2019-11-06 MED ORDER — TRAZODONE HCL 50 MG PO TABS: 25.0000 mg | ORAL_TABLET | Freq: Every evening | ORAL | 2 refills | Status: DC | PRN

## 2019-11-08 ENCOUNTER — Other Ambulatory Visit
Admission: RE | Admit: 2019-11-08 | Discharge: 2019-11-08 | Disposition: A | Discharge status: Home / Self Care | Payer: BC Managed Care – PPO | Source: Ambulatory Visit | Attending: Gastroenterology | Admitting: Gastroenterology

## 2019-11-08 ENCOUNTER — Other Ambulatory Visit: Payer: Self-pay

## 2019-11-08 DIAGNOSIS — Z20822 Contact with and (suspected) exposure to covid-19: Secondary | ICD-10-CM | POA: Diagnosis not present

## 2019-11-08 DIAGNOSIS — Z01812 Encounter for preprocedural laboratory examination: Secondary | ICD-10-CM | POA: Diagnosis present

## 2019-11-09 LAB — SARS CORONAVIRUS 2 (TAT 6-24 HRS): SARS Coronavirus 2: NEGATIVE

## 2019-11-12 ENCOUNTER — Encounter
Admission: RE | Disposition: A | Discharge status: Home / Self Care | Payer: Self-pay | Source: Home / Self Care | Attending: Gastroenterology

## 2019-11-12 ENCOUNTER — Encounter: Payer: Self-pay | Admitting: Anesthesiology

## 2019-11-12 ENCOUNTER — Ambulatory Visit
Admission: RE | Admit: 2019-11-12 | Discharge: 2019-11-12 | Disposition: A | Discharge status: Home / Self Care | Payer: BC Managed Care – PPO | Attending: Gastroenterology | Admitting: Gastroenterology

## 2019-11-12 ENCOUNTER — Ambulatory Visit: Payer: Self-pay | Admitting: Anesthesiology

## 2019-11-12 DIAGNOSIS — K219 Gastro-esophageal reflux disease without esophagitis: Secondary | ICD-10-CM | POA: Insufficient documentation

## 2019-11-12 DIAGNOSIS — Z8371 Family history of colonic polyps: Secondary | ICD-10-CM | POA: Diagnosis not present

## 2019-11-12 DIAGNOSIS — Z91048 Other nonmedicinal substance allergy status: Secondary | ICD-10-CM | POA: Diagnosis not present

## 2019-11-12 DIAGNOSIS — I499 Cardiac arrhythmia, unspecified: Secondary | ICD-10-CM | POA: Diagnosis not present

## 2019-11-12 DIAGNOSIS — Z79899 Other long term (current) drug therapy: Secondary | ICD-10-CM | POA: Diagnosis not present

## 2019-11-12 DIAGNOSIS — Z801 Family history of malignant neoplasm of trachea, bronchus and lung: Secondary | ICD-10-CM | POA: Diagnosis not present

## 2019-11-12 DIAGNOSIS — F419 Anxiety disorder, unspecified: Secondary | ICD-10-CM | POA: Diagnosis not present

## 2019-11-12 DIAGNOSIS — Z8249 Family history of ischemic heart disease and other diseases of the circulatory system: Secondary | ICD-10-CM | POA: Insufficient documentation

## 2019-11-12 DIAGNOSIS — R1013 Epigastric pain: Secondary | ICD-10-CM | POA: Diagnosis present

## 2019-11-12 DIAGNOSIS — Z800 Family history of malignant neoplasm of digestive organs: Secondary | ICD-10-CM | POA: Insufficient documentation

## 2019-11-12 DIAGNOSIS — Z90710 Acquired absence of both cervix and uterus: Secondary | ICD-10-CM | POA: Insufficient documentation

## 2019-11-12 HISTORY — PX: ESOPHAGOGASTRODUODENOSCOPY (EGD) WITH PROPOFOL: SHX5813

## 2019-11-12 SURGERY — ESOPHAGOGASTRODUODENOSCOPY (EGD) WITH PROPOFOL
Anesthesia: General

## 2019-11-12 MED ORDER — LIDOCAINE HCL (CARDIAC) PF 100 MG/5ML IV SOSY
PREFILLED_SYRINGE | INTRAVENOUS | Status: DC | PRN
  Administered 2019-11-12: 100 mg via INTRAVENOUS

## 2019-11-12 MED ORDER — PROPOFOL 10 MG/ML IV BOLUS
INTRAVENOUS | Status: DC | PRN
  Administered 2019-11-12: 70 mg via INTRAVENOUS

## 2019-11-12 MED ORDER — SODIUM CHLORIDE 0.9 % IV SOLN
INTRAVENOUS | Status: DC
  Administered 2019-11-12: 1000 mL via INTRAVENOUS

## 2019-11-12 MED ORDER — PROPOFOL 500 MG/50ML IV EMUL
INTRAVENOUS | Status: DC | PRN
  Administered 2019-11-12: 140 ug/kg/min via INTRAVENOUS
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
## 2019-11-12 NOTE — Op Note (Signed)
[facility] Patient Name: Lorraine Morgan Procedure Date: 11/12/2019 8:52 AM MRN: [id number] Account #: [id number] Date of Birth: 07/09/1971 Admit Type: Outpatient Age: 49 Room: [facility] ENDO ROOM 4 Gender: Female Note Status: Finalized Procedure:             Upper GI endoscopy Indications:           Epigastric abdominal pain Providers:             [name] MD, MD Referring MD:          [name], MD (Referring MD) Medicines:             Propofol per Anesthesia Complications:         No immediate complications. Procedure:             Pre-Anesthesia Assessment:                        - Prior to the procedure, a History and Physical was                         performed, and patient medications and allergies were                         reviewed. The patient's tolerance of previous                         anesthesia was also reviewed. The risks and benefits                         of the procedure and the sedation options and risks                         were discussed with the patient. All questions were                         answered, and informed consent was obtained. Prior                         Anticoagulants: The patient has taken no previous                         anticoagulant or antiplatelet agents. ASA Grade                         Assessment: II - A patient with mild systemic disease.                         After reviewing the risks and benefits, the patient                         was deemed in satisfactory condition to undergo the                         procedure.                        After obtaining informed consent, the endoscope was  passed under direct vision. Throughout the procedure,                         the patient's blood pressure, pulse, and oxygen                         saturations were monitored continuously. The Endoscope                         was introduced through the mouth, and  advanced to the                         second part of duodenum. The upper GI endoscopy was                         accomplished without difficulty. The patient tolerated                         the procedure well. Findings:      The examined esophagus was normal.      The entire examined stomach was normal.      The examined duodenum was normal. Impression:            - Normal esophagus.                        - Normal stomach.                        - Normal examined duodenum.                        - No specimens collected. Recommendation:        - Discharge patient to home.                        - Resume previous diet.                        - Continue present medications. Procedure Code(s):     --- Professional ---                        [phone], Esophagogastroduodenoscopy, flexible,                         transoral; diagnostic, including collection of                         specimen(s) by brushing or washing, when performed                         (separate procedure) Diagnosis Code(s):     --- Professional ---                        R10.13, Epigastric pain CPT copyright 2019 American Medical Association. All rights reserved. The codes documented in this report are preliminary and upon coder review may  be revised to meet current compliance requirements. [name] MD, MD 11/12/2019 9:01:13 AM This report has been signed electronically. Number of Addenda: 0 Note Initiated On: 11/12/2019 8:52 AM Estimated Blood Loss:  Estimated  blood loss: none.      [facility]
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
## 2019-11-12 NOTE — Transfer of Care (Signed)
Immediate Anesthesia Transfer of Care Note  Patient: Lorraine Morgan  Procedure(s) Performed: ESOPHAGOGASTRODUODENOSCOPY (EGD) WITH PROPOFOL (N/A )  Patient Location: PACU  Anesthesia Type:General  Level of Consciousness: awake  Airway & Oxygen Therapy: Patient Spontanous Breathing and Patient connected to nasal cannula oxygen  Post-op Assessment: Report given to RN and Post -op Vital signs reviewed and stable  Post vital signs: Reviewed and stable  Last Vitals:  Vitals Value Taken Time  BP 124/81 11/12/19 0904  Temp 36.6 C 11/12/19 0902  Pulse 74 11/12/19 0904  Resp 18 11/12/19 0904  SpO2 100 % 11/12/19 0904  Vitals shown include unvalidated device data.  Last Pain:  Vitals:   11/12/19 0902  TempSrc:   PainSc: Asleep         Complications: No apparent anesthesia complications

## 2019-11-13 ENCOUNTER — Encounter: Payer: Self-pay | Admitting: *Deleted

## 2019-11-14 NOTE — Anesthesia Postprocedure Evaluation (Signed)
Anesthesia Post Note  Patient: Lorraine Morgan  Procedure(s) Performed: ESOPHAGOGASTRODUODENOSCOPY (EGD) WITH PROPOFOL (N/A )  Patient location during evaluation: PACU Anesthesia Type: General Level of consciousness: awake and alert Pain management: pain level controlled Vital Signs Assessment: post-procedure vital signs reviewed and stable Respiratory status: spontaneous breathing, nonlabored ventilation and respiratory function stable Cardiovascular status: blood pressure returned to baseline and stable Postop Assessment: no apparent nausea or vomiting Anesthetic complications: no     Last Vitals:  Vitals:   11/12/19 0922 11/12/19 0932  BP: 118/77 113/80  Pulse: 65 72  Resp: 11 17  Temp:    SpO2: 100% 100%    Last Pain:  Vitals:   11/13/19 0729  TempSrc:   PainSc: 0-No pain                 Karleen Hampshire

## 2019-11-29 ENCOUNTER — Other Ambulatory Visit: Payer: Self-pay

## 2019-11-29 ENCOUNTER — Ambulatory Visit (INDEPENDENT_AMBULATORY_CARE_PROVIDER_SITE_OTHER): Payer: BC Managed Care – PPO | Admitting: Internal Medicine

## 2019-11-29 ENCOUNTER — Ambulatory Visit
Admission: RE | Admit: 2019-11-29 | Discharge: 2019-11-29 | Disposition: A | Discharge status: Home / Self Care | Payer: BC Managed Care – PPO | Source: Ambulatory Visit | Attending: Obstetrics and Gynecology | Admitting: Obstetrics and Gynecology

## 2019-11-29 DIAGNOSIS — K219 Gastro-esophageal reflux disease without esophagitis: Secondary | ICD-10-CM | POA: Diagnosis not present

## 2019-11-29 DIAGNOSIS — Z1231 Encounter for screening mammogram for malignant neoplasm of breast: Secondary | ICD-10-CM

## 2019-11-29 DIAGNOSIS — G479 Sleep disorder, unspecified: Secondary | ICD-10-CM

## 2019-11-29 MED ORDER — PANTOPRAZOLE SODIUM 40 MG PO TBEC: 40.0000 mg | DELAYED_RELEASE_TABLET | Freq: Every day | ORAL | 2 refills | Status: DC

## 2019-11-29 MED ORDER — TRAZODONE HCL 50 MG PO TABS: ORAL_TABLET | ORAL | 2 refills | Status: DC
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
## 2019-12-02 ENCOUNTER — Encounter: Payer: Self-pay | Admitting: Internal Medicine

## 2019-12-02 NOTE — Assessment & Plan Note (Signed)
Symptoms controlled.  On protonix.   

## 2019-12-02 NOTE — Assessment & Plan Note (Signed)
On trazodone.  Not sleeping well now.  Increase trazodone to 1.5 tablets (75mg ) q hs.  Follow.  Call with update.

## 2020-01-10 ENCOUNTER — Telehealth: Payer: Self-pay | Admitting: Internal Medicine

## 2020-01-10 NOTE — Telephone Encounter (Signed)
LMTCB

## 2020-01-10 NOTE — Telephone Encounter (Signed)
Reviewed.  Please f/u with pt and confirm being evaluated.  Also, see where tested and pending when symptoms started - if qualifies for monoclonal antibody infusion.  Keep [place] posted.

## 2020-01-10 NOTE — Telephone Encounter (Signed)
Reason for Triage Call: patient positive for COVID on 01/04/20. Works at [employer] went to get tested because of cough.   Symptoms:Moderate SOB with Activity eases with rest. NO fever or chills, Coughing a lot last night, productive with yellowish mucous. Headache deny s body aches. Speaking in full sentences reports some SOB while talking.   Pain Scale:, headaches, location, Frontal headache and level of pain  Rated at a 3 on a scale of 0-10  Vitals: BP _____98/72___ , Pulse _72_________,  02 Sats  _________, Respirations _________, Temp ____98.3_______,   Onset:01/04/20  Duration: 6 days  Medications: medication not used  Last seen for this problem: has not been seen .   Outcome:  Patient going to [facility] for treatment.  Attach Access Nurse Triage Note. [facility] - [place] TELEPHONE ADVICE RECORD AccessNurse Patient Name: Lorraine Morgan Gender: Female DOB: May 02, 1971 Age: 49 Y 4 M 17 D Return Phone Number: [phone] (Primary), [phone] (Secondary) Address: City/State/Zip: [place] [address] Client [facility] - [facility] Client Site  [facility] [place] [facility] - Day Physician Dale Republic - MD Contact Type Call Who Is Calling Patient / Member / Family / Caregiver Call Type Triage / Clinical Relationship To Patient Self Return Phone Number [phone] (Primary) Chief Complaint BREATHING - shortness of breath or sounds breathless Reason for Call Symptomatic / Request for Health Information Initial Comment Caller states she has COVID and is having shortness of breath and bad cough. Having trouble breathing. Translation No Nurse Assessment Nurse: May, RN, Tammy Date/Time Lamount Cohen Time): 01/10/2020 8:32:57 AM Confirm and document reason for call. If symptomatic, describe symptoms. ---Caller states she was diagnosed with covid on Friday. She is now having a bad cough and mild shortness of  breath. Has the patient had close contact with a person known or suspected to have the novel coronavirus illness OR traveled / lives in area with major community spread (including international travel) in the last 14 days from the onset of symptoms? * If Asymptomatic, screen for exposure and travel within the last 14 days. ---Yes Does the patient have any new or worsening symptoms? ---Yes Will a triage be completed? ---Yes Related visit to physician within the last 2 weeks? ---Yes Does the PT have any chronic conditions? (i.e. diabetes, asthma, this includes High risk factors for pregnancy, etc.) ---No Is the patient pregnant or possibly pregnant? (Ask all females between the ages of 52-55) ---No Is this a behavioral health or substance abuse call? ---No Guidelines Guideline Title Affirmed Question Affirmed Notes Nurse Date/Time (Eastern Time) Coronavirus (COVID-19) - Diagnosed or Suspected MILD difficulty breathing (e.g., minimal/no SOB at rest, SOB with walking, pulse <100) May, RN, Tammy 01/10/2020 8:34:37 AMPLEASE NOTE: All timestamps contained within this report are represented as [place] Standard Time. CONFIDENTIALTY NOTICE: This fax transmission is intended only for the addressee. It contains information that is legally privileged, confidential or otherwise protected from use or disclosure. If you are not the intended recipient, you are strictly prohibited from reviewing, disclosing, copying using or disseminating any of this information or taking any action in reliance on or regarding this information. If you have received this fax in error, please notify us immediately by telephone so that we can arrange for its return to [place]. Phone: [phone], Toll-Free: [phone], Fax: [phone] Page: 2 of 2 Call Id: [id number] Disp. Time Lamount Cohen Time) Disposition Final User 01/10/2020 8:29:30 AM Send to Urgent Queue Gwenlyn Found 01/10/2020 8:38:02 AM Go to  ED Now (or PCP triage)  Yes May, RN, Tammy Caller Disagree/Comply Comply Caller Understands Yes PreDisposition Did not know what to do Care Advice Given Per Guideline GO TO ED NOW (OR PCP TRIAGE): * IF NO PCP (PRIMARY CARE PROVIDER) SECOND-LEVEL TRIAGE: You need to be seen within the next hour. Go to the [facility] at _____________ [place] as soon as you can. GENERAL CARE ADVICE FOR COVID-19 SYMPTOMS: * The treatment is the same whether you have COVID-19, influenza or some other respiratory virus. * Feeling dehydrated: Drink extra liquids. If the air in your home is dry, use a humidifier. * Fever: For fever over 101 F (38.3 C), take acetaminophen every 4 to 6 hours (Adults 650 mg) OR ibuprofen every 6-8 hours (Adults 400 mg). Before taking any medicine, read all the instructions on the package. Do not take aspirin unless your doctor has prescribed it for you. CARE ADVICE given per CORONAVIRUS (COVID-19) - DIAGNOSED OR SUSPECTED (Adult) guideline

## 2020-01-10 NOTE — Telephone Encounter (Signed)
Pt called in and said she was diagnosed with Covid about a week ago. Pt said she was doing ok but then her coughed picked up and she is now experiencing shortness of breath. I transferred patient to Access Nurse to be triaged.

## 2020-01-14 NOTE — Telephone Encounter (Signed)
Pt was evaluated, doing ok. Was evaluated at urgent care- given abx and prednisone. Occupational health has assessed and wrote her to work from home this week and will reassess about coming back to work on Monday. Patients symptoms started on 3/8. She is too many days out for infusion.

## 2020-02-28 ENCOUNTER — Encounter: Payer: Self-pay | Admitting: Internal Medicine

## 2020-02-28 ENCOUNTER — Other Ambulatory Visit: Payer: Self-pay

## 2020-02-28 ENCOUNTER — Ambulatory Visit: Payer: BC Managed Care – PPO | Admitting: Internal Medicine

## 2020-02-28 DIAGNOSIS — G479 Sleep disorder, unspecified: Secondary | ICD-10-CM

## 2020-02-28 DIAGNOSIS — F411 Generalized anxiety disorder: Secondary | ICD-10-CM | POA: Diagnosis not present

## 2020-02-28 DIAGNOSIS — Z8371 Family history of colonic polyps: Secondary | ICD-10-CM | POA: Diagnosis not present

## 2020-02-28 DIAGNOSIS — K219 Gastro-esophageal reflux disease without esophagitis: Secondary | ICD-10-CM

## 2020-02-28 MED ORDER — BUSPIRONE HCL 7.5 MG PO TABS: 7.5000 mg | ORAL_TABLET | Freq: Two times a day (BID) | ORAL | 1 refills | Status: DC | PRN
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
## 2020-03-01 ENCOUNTER — Encounter: Payer: Self-pay | Admitting: Internal Medicine

## 2020-03-01 NOTE — Assessment & Plan Note (Signed)
Colonoscopy 10/2015.  Recommended f/u in 5 years.

## 2020-03-01 NOTE — Assessment & Plan Note (Signed)
On buspar and doing well.  Follow.

## 2020-03-01 NOTE — Assessment & Plan Note (Signed)
No upper symptoms reported.  On protonix.   

## 2020-03-01 NOTE — Assessment & Plan Note (Signed)
Doing well on trazodone.  Follow.   

## 2020-08-25 ENCOUNTER — Ambulatory Visit (INDEPENDENT_AMBULATORY_CARE_PROVIDER_SITE_OTHER): Payer: BC Managed Care – PPO | Admitting: Internal Medicine

## 2020-08-25 ENCOUNTER — Encounter: Payer: Self-pay | Admitting: Internal Medicine

## 2020-08-25 ENCOUNTER — Other Ambulatory Visit: Payer: Self-pay

## 2020-08-25 VITALS — BP 112/70 | HR 79 | Temp 98.2°F | Resp 16 | Ht 60.0 in | Wt 151.4 lb

## 2020-08-25 DIAGNOSIS — F411 Generalized anxiety disorder: Secondary | ICD-10-CM

## 2020-08-25 DIAGNOSIS — E7800 Pure hypercholesterolemia, unspecified: Secondary | ICD-10-CM | POA: Diagnosis not present

## 2020-08-25 DIAGNOSIS — G479 Sleep disorder, unspecified: Secondary | ICD-10-CM | POA: Diagnosis not present

## 2020-08-25 DIAGNOSIS — R7989 Other specified abnormal findings of blood chemistry: Secondary | ICD-10-CM

## 2020-08-25 DIAGNOSIS — K219 Gastro-esophageal reflux disease without esophagitis: Secondary | ICD-10-CM

## 2020-08-25 DIAGNOSIS — Z8371 Family history of colonic polyps: Secondary | ICD-10-CM

## 2020-08-25 LAB — LIPID PANEL
Cholesterol: 203 mg/dL — ABNORMAL HIGH (ref 0–200)
HDL: 63 mg/dL (ref >39.00)
LDL Cholesterol: 124 mg/dL — ABNORMAL HIGH (ref 0–99)
NonHDL: 139.73
Total CHOL/HDL Ratio: 3
Triglycerides: 77 mg/dL (ref 0.0–149.0)
VLDL: 15.4 mg/dL (ref 0.0–40.0)

## 2020-08-25 LAB — CBC WITH DIFFERENTIAL/PLATELET
Basophils Absolute: 0 10*3/uL (ref 0.0–0.1)
Basophils Relative: 0.6 % (ref 0.0–3.0)
Eosinophils Absolute: 0 10*3/uL (ref 0.0–0.7)
Eosinophils Relative: 0.9 % (ref 0.0–5.0)
HCT: 38.9 % (ref 36.0–46.0)
Hemoglobin: 13 g/dL (ref 12.0–15.0)
Lymphocytes Relative: 47.3 % — ABNORMAL HIGH (ref 12.0–46.0)
Lymphs Abs: 2.1 10*3/uL (ref 0.7–4.0)
MCHC: 33.3 g/dL (ref 30.0–36.0)
MCV: 89.5 fl (ref 78.0–100.0)
Monocytes Absolute: 0.4 10*3/uL (ref 0.1–1.0)
Monocytes Relative: 9.3 % (ref 3.0–12.0)
Neutro Abs: 1.8 10*3/uL (ref 1.4–7.7)
Neutrophils Relative %: 41.9 % — ABNORMAL LOW (ref 43.0–77.0)
Platelets: 197 10*3/uL (ref 150.0–400.0)
RBC: 4.34 Mil/uL (ref 3.87–5.11)
RDW: 13.1 % (ref 11.5–15.5)
WBC: 4.4 10*3/uL (ref 4.0–10.5)

## 2020-08-25 LAB — COMPREHENSIVE METABOLIC PANEL
ALT: 15 U/L (ref 0–35)
AST: 18 U/L (ref 0–37)
Albumin: 4.3 g/dL (ref 3.5–5.2)
Alkaline Phosphatase: 64 U/L (ref 39–117)
BUN: 16 mg/dL (ref 6–23)
CO2: 30 mEq/L (ref 19–32)
Calcium: 9.3 mg/dL (ref 8.4–10.5)
Chloride: 104 mEq/L (ref 96–112)
Creatinine, Ser: 0.76 mg/dL (ref 0.40–1.20)
GFR: 93.59 mL/min (ref >60.00)
Glucose, Bld: 94 mg/dL (ref 70–99)
Potassium: 4.1 mEq/L (ref 3.5–5.1)
Sodium: 140 mEq/L (ref 135–145)
Total Bilirubin: 0.4 mg/dL (ref 0.2–1.2)
Total Protein: 7 g/dL (ref 6.0–8.3)

## 2020-08-25 LAB — TSH: TSH: 0.3 u[IU]/mL — ABNORMAL LOW (ref 0.35–4.50)
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
## 2020-08-26 ENCOUNTER — Other Ambulatory Visit: Payer: Self-pay | Admitting: Internal Medicine

## 2020-08-26 ENCOUNTER — Encounter: Payer: Self-pay | Admitting: Internal Medicine

## 2020-08-26 ENCOUNTER — Other Ambulatory Visit (INDEPENDENT_AMBULATORY_CARE_PROVIDER_SITE_OTHER): Payer: BC Managed Care – PPO

## 2020-08-26 DIAGNOSIS — R946 Abnormal results of thyroid function studies: Secondary | ICD-10-CM | POA: Diagnosis not present

## 2020-08-26 DIAGNOSIS — R7989 Other specified abnormal findings of blood chemistry: Secondary | ICD-10-CM

## 2020-08-26 LAB — T3, FREE: T3, Free: 3 pg/mL (ref 2.3–4.2)

## 2020-08-26 LAB — T4, FREE: Free T4: 0.8 ng/dL (ref 0.60–1.60)

## 2020-08-26 NOTE — Telephone Encounter (Signed)
Patient is scheduled for 10/09/20 AMS for annual

## 2020-08-26 NOTE — Telephone Encounter (Signed)
Can you set pt up with an annual with AMS

## 2020-08-26 NOTE — Progress Notes (Signed)
Order placed for add on labd.

## 2020-08-30 ENCOUNTER — Encounter: Payer: Self-pay | Admitting: Internal Medicine

## 2020-08-30 NOTE — Assessment & Plan Note (Signed)
Low cholesterol diet and exercise.  Follow lipid panel.   

## 2020-08-30 NOTE — Assessment & Plan Note (Signed)
Doing well on trazodone.  Follow.   

## 2020-08-30 NOTE — Assessment & Plan Note (Signed)
On protonix.  No acid reflux.  Follow.   

## 2020-08-30 NOTE — Assessment & Plan Note (Signed)
Doing well.  Has buspar if needed.  Follow.

## 2020-08-30 NOTE — Assessment & Plan Note (Signed)
Colonoscopy 10/2015.  Recommended f/u in 5 years.  Intermittent stomach pain/diarrhea.  On prootnix to control acid reflux.  Has f/u with Dr Servando Snare next week.  Due colonoscopy 10/2020.

## 2020-08-31 ENCOUNTER — Other Ambulatory Visit: Payer: Self-pay

## 2020-08-31 ENCOUNTER — Ambulatory Visit: Payer: BC Managed Care – PPO | Admitting: Gastroenterology

## 2020-08-31 ENCOUNTER — Encounter: Payer: Self-pay | Admitting: Gastroenterology

## 2020-08-31 VITALS — BP 118/64 | HR 72 | Ht 60.0 in | Wt 152.0 lb

## 2020-08-31 DIAGNOSIS — R197 Diarrhea, unspecified: Secondary | ICD-10-CM | POA: Diagnosis not present
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
## 2020-09-02 ENCOUNTER — Other Ambulatory Visit: Payer: Self-pay | Admitting: Internal Medicine

## 2020-09-03 ENCOUNTER — Other Ambulatory Visit: Payer: Self-pay

## 2020-09-03 DIAGNOSIS — Z1211 Encounter for screening for malignant neoplasm of colon: Secondary | ICD-10-CM

## 2020-09-03 DIAGNOSIS — Z8371 Family history of colonic polyps: Secondary | ICD-10-CM

## 2020-10-09 ENCOUNTER — Ambulatory Visit: Payer: BC Managed Care – PPO | Admitting: Obstetrics and Gynecology

## 2020-10-12 ENCOUNTER — Ambulatory Visit: Payer: BC Managed Care – PPO | Admitting: Obstetrics and Gynecology

## 2020-10-25 ENCOUNTER — Encounter: Payer: Self-pay | Admitting: Internal Medicine

## 2020-10-26 NOTE — Telephone Encounter (Signed)
Please call Lorraine Morgan and confirm doing ok.  Can schedule an appt for further evaluation and to discuss treatment options

## 2020-10-27 NOTE — Telephone Encounter (Signed)
Pt scheduled  

## 2020-10-28 ENCOUNTER — Telehealth (INDEPENDENT_AMBULATORY_CARE_PROVIDER_SITE_OTHER): Payer: BC Managed Care – PPO | Admitting: Internal Medicine

## 2020-10-28 ENCOUNTER — Other Ambulatory Visit: Payer: Self-pay

## 2020-10-28 ENCOUNTER — Encounter: Payer: Self-pay | Admitting: Internal Medicine

## 2020-10-28 DIAGNOSIS — G479 Sleep disorder, unspecified: Secondary | ICD-10-CM

## 2020-10-28 DIAGNOSIS — F411 Generalized anxiety disorder: Secondary | ICD-10-CM

## 2020-10-28 MED ORDER — ESCITALOPRAM OXALATE 10 MG PO TABS: 10.0000 mg | ORAL_TABLET | Freq: Every day | ORAL | 1 refills | Status: DC

## 2020-10-28 NOTE — Assessment & Plan Note (Signed)
Has previously done well on trazodone. Increased anxiety now.  Feel the anxiety is contributing to her sleep difficulty.  Treat anxiety.  Start lexapro 10mg  q day.  Decrease trazodone to 50mg  q hs.  Follow closely.  Call with update.

## 2020-10-28 NOTE — Assessment & Plan Note (Signed)
Increased stress and anxiety as outlined.  buspar is not working.  Needs something to help her sleep.  Stop buspar.  Start lexapro 10mg  q day.  Decrease trazodone to 50mg  q hs.  Follow closely.  Call with update.
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
## 2020-10-29 ENCOUNTER — Other Ambulatory Visit: Payer: Self-pay

## 2020-10-29 ENCOUNTER — Encounter: Payer: Self-pay | Admitting: Gastroenterology

## 2020-11-05 ENCOUNTER — Other Ambulatory Visit: Payer: Self-pay

## 2020-11-05 ENCOUNTER — Ambulatory Visit (INDEPENDENT_AMBULATORY_CARE_PROVIDER_SITE_OTHER): Payer: BC Managed Care – PPO | Admitting: Obstetrics and Gynecology

## 2020-11-05 ENCOUNTER — Other Ambulatory Visit
Admission: RE | Admit: 2020-11-05 | Discharge: 2020-11-05 | Disposition: A | Discharge status: Home / Self Care | Payer: BC Managed Care – PPO | Source: Ambulatory Visit | Attending: Gastroenterology | Admitting: Gastroenterology

## 2020-11-05 ENCOUNTER — Encounter: Payer: Self-pay | Admitting: Obstetrics and Gynecology

## 2020-11-05 VITALS — BP 112/60 | Ht 60.0 in | Wt 149.0 lb

## 2020-11-05 DIAGNOSIS — Z20822 Contact with and (suspected) exposure to covid-19: Secondary | ICD-10-CM | POA: Insufficient documentation

## 2020-11-05 DIAGNOSIS — Z01419 Encounter for gynecological examination (general) (routine) without abnormal findings: Secondary | ICD-10-CM

## 2020-11-05 DIAGNOSIS — Z1239 Encounter for other screening for malignant neoplasm of breast: Secondary | ICD-10-CM | POA: Diagnosis not present

## 2020-11-05 DIAGNOSIS — Z01812 Encounter for preprocedural laboratory examination: Secondary | ICD-10-CM | POA: Diagnosis not present

## 2020-11-05 LAB — SARS CORONAVIRUS 2 (TAT 6-24 HRS): SARS Coronavirus 2: NEGATIVE

## 2020-11-05 MED ORDER — HYDROXYZINE HCL 25 MG PO TABS: 25.0000 mg | ORAL_TABLET | Freq: Four times a day (QID) | ORAL | 2 refills | Status: DC | PRN

## 2020-11-05 NOTE — Progress Notes (Signed)
Gynecology Annual Exam  PCP: Dale [place], MD  Chief Complaint:  Chief Complaint  Patient presents with  . Gynecologic Exam    Annual - discuss hot flashes. RM 5    History of Present Illness: Patient is a 50 y.o. [id number] presents for annual exam. The patient has no complaints today.   LMP: Patient's last menstrual period was 08/10/2010. Status post hysterectomy  The patient is sexually active. She currently uses status post hysterectomy for contraception. She denies dyspareunia.  The patient does perform self breast exams.  There is no notable family history of breast or ovarian cancer in her family.  The patient wears seatbelts: yes.   The patient has regular exercise: not asked.    The patient denies current symptoms of depression.    Review of Systems: Review of Systems  Constitutional: Negative for chills and fever.  HENT: Negative for congestion.   Respiratory: Negative for cough and shortness of breath.   Cardiovascular: Negative for chest pain and palpitations.  Gastrointestinal: Negative for abdominal pain, constipation, diarrhea, heartburn, nausea and vomiting.  Genitourinary: Negative for dysuria, frequency and urgency.  Skin: Negative for itching and rash.  Neurological: Negative for dizziness and headaches.  Endo/Heme/Allergies: Negative for polydipsia.  Psychiatric/Behavioral: Negative for depression.    Past Medical History:  Patient Active Problem List   Diagnosis Date Noted  . Hypercholesterolemia 08/25/2020  . Abdominal pain, epigastric   . Chest pain 08/17/2019  . Difficulty sleeping 01/29/2019  . Epigastric pain 11/21/2018  . Breast mass, left 02/22/2018  . Health care maintenance 07/24/2016  . GAD (generalized anxiety disorder) 03/02/2016  . Pain in the chest 01/25/2016  . Family history of colonic polyps   . Abnormal mammogram 02/05/2015  . Rash 12/15/2013  . Conjunctivitis 03/26/2013  . Abnormal Pap smear of cervix 01/13/2013  . GERD  (gastroesophageal reflux disease) 01/13/2013    EGD 09/12/11 - gastritis, normal examined duodenum.     . Chronic constipation 01/13/2013  . Endometriosis 01/13/2013    Past Surgical History:  Past Surgical History:  Procedure Laterality Date  . COLONOSCOPY    . COLONOSCOPY WITH PROPOFOL N/A 11/09/2015   Procedure: COLONOSCOPY WITH PROPOFOL;  Surgeon: [name], MD;  Location: [facility];  Service: Endoscopy;  Laterality: N/A;  . DILATION AND CURETTAGE OF UTERUS  2005  . DILATION AND CURETTAGE OF UTERUS  2011   and lap  . ESOPHAGOGASTRODUODENOSCOPY (EGD) WITH PROPOFOL N/A 11/12/2019   Procedure: ESOPHAGOGASTRODUODENOSCOPY (EGD) WITH PROPOFOL;  Surgeon: [name], MD;  Location: [facility] END[facility];  Service: Endoscopy;  Laterality: N/A;  . LAPAROSCOPIC SUPRACERVICAL HYSTERECTOMY  07/2010   ovaries not removed  . TUBAL LIGATION  2007    Gynecologic History:  Patient's last menstrual period was 08/10/2010. Contraception: status post hysterectomy Last Pap: Results were: 09/03/2018 NIL and HR HPV negative  Last mammogram: 11/29/2019 Results were: BI-RAD I  Obstetric History: [id number]  Family History:  Family History  Problem Relation Age of Onset  . Cancer Father        Lung  . Colon polyps Father   . Diabetes Maternal Grandmother   . Cancer Maternal Grandfather        Lung  . Cancer Paternal Grandmother        Colon  . Heart disease Paternal Grandfather   . Hypercholesterolemia Mother   . Breast cancer Neg Hx     Social History:  Social History   Socioeconomic History  . Marital status: Married  Spouse name: Not on file  . Number of children: 2  . Years of education: Not on file  . Highest education level: Not on file  Occupational History    Employer: [employer]  Tobacco Use  . Smoking status: Never Smoker  . Smokeless tobacco: Never Used  Vaping Use  . Vaping Use: Never used  Substance and Sexual Activity  . Alcohol use: No    Alcohol/week: 0.0  standard drinks    Comment: Rare  . Drug use: No  . Sexual activity: Yes    Partners: Male    Birth control/protection: Surgical    Comment: Hysterectomy  Other Topics Concern  . Not on file  Social History Narrative   Caffeine- 1 soda or tea daily    Social Determinants of Health   Financial Resource Strain: Not on file  Food Insecurity: Not on file  Transportation Needs: Not on file  Physical Activity: Not on file  Stress: Not on file  Social Connections: Not on file  Intimate Partner Violence: Not on file    Allergies:  Allergies  Allergen Reactions  . Adhesive [Tape] Rash    Medications: Prior to Admission medications   Medication Sig Start Date End Date Taking? Authorizing Provider  escitalopram (LEXAPRO) 10 MG tablet Take 1 tablet (10 mg total) by mouth daily. 10/28/20  Yes Dale [place], MD  naproxen sodium (ANAPROX) 220 MG tablet Take 220 mg by mouth 2 (two) times daily as needed. Reported on 11/09/2015   Yes [provider]  pantoprazole (PROTONIX) 40 MG tablet TAKE 1 TABLET(40 MG) BY MOUTH DAILY 09/03/20  Yes Dale Benjamin, MD  traZODone (DESYREL) 50 MG tablet Take 1 1/2 tablet hs 11/29/19  Yes Dale [place], MD    Physical Exam Vitals: Blood pressure 112/60, height 5' (1.524 m), weight 149 lb (67.6 kg), last menstrual period 08/10/2010.  General: NAD HEENT: normocephalic, anicteric Thyroid: no enlargement, no palpable nodules Pulmonary: No increased work of breathing, CTAB Cardiovascular: RRR, distal pulses 2+ Breast: Breast symmetrical, no tenderness, no palpable nodules or masses, no skin or nipple retraction present, no nipple discharge.  No axillary or supraclavicular lymphadenopathy. Abdomen: NABS, soft, non-tender, non-distended.  Umbilicus without lesions.  No hepatomegaly, splenomegaly or masses palpable. No evidence of hernia  Genitourinary:  External: Normal external female genitalia.  Normal urethral meatus, normal Bartholin's and  Skene's glands.    Vagina: Normal vaginal mucosa, no evidence of prolapse.    Cervix: Grossly normal in appearance, no bleeding  Uterus: surgically absent  Adnexa: ovaries non-enlarged, no adnexal masses  Rectal: deferred  Lymphatic: no evidence of inguinal lymphadenopathy Extremities: no edema, erythema, or tenderness Neurologic: Grossly intact Psychiatric: mood appropriate, affect full  Female chaperone present for pelvic and breast  portions of the physical exam  GAD 7 : Generalized Anxiety Score 11/05/2020  Nervous, Anxious, on Edge 0  Control/stop worrying 3  Worry too much - different things 3  Trouble relaxing 3  Restless 0  Easily annoyed or irritable 0  Afraid - awful might happen 0  Total GAD 7 Score 9  Anxiety Difficulty Not difficult at all    Flowsheet Row Office Visit from 07/21/2017 in [facility]  PHQ-9 Total Score 0     Immunization History  Administered Date(s) Administered  . DTaP 10/24/2010  . Influenza Split 07/19/2013  . Influenza Whole 07/05/2017  . Influenza,inj,Quad PF,6+ Mos 07/31/2013  . Influenza-Unspecified 07/17/2018, 07/08/2019, 07/29/2020  . Moderna Sars-Covid-2 Vaccination 02/04/2020, 03/05/2020  Assessment: 49 y.o. [id number] routine annual exam  Plan: Problem List Items Addressed This Visit   None     1) Mammogram - recommend yearly screening mammogram.  Mammogram Was ordered today   2) STI screening  was notoffered and therefore not obtained  3) ASCCP guidelines and rational discussed.  Patient opts for every 3 years screening interval  4) Contraception - the patient is currently using  status post hysterectomy.  She is not currently in need of contraception secondary to being sterile  5) Colonoscopy -- up to date repeat scheduled next week  6) Routine healthcare maintenance including cholesterol, diabetes screening discussed managed by PCP  7) Anxiety - followed by Dr. Lorin Picket recently switched to Lexapro  10mg .  Discussed adding prn vistaril for any breakthrough anxiety until Lexpro take full theraputic effect  8) No follow-ups on file.   , MD, Vena Austria [facility], [facility] 11/05/2020, 9:45 AM

## 2020-11-05 NOTE — Patient Instructions (Signed)
[facility] [address] [place] [address]  [facility] [place]  [address] [place] [address]  Phone: [phone]  

## 2020-11-05 NOTE — Discharge Instructions (Signed)

## 2020-11-07 ENCOUNTER — Encounter: Payer: Self-pay | Admitting: Internal Medicine

## 2020-11-19 MED ORDER — SERTRALINE HCL 25 MG PO TABS: 25.0000 mg | ORAL_TABLET | Freq: Every day | ORAL | 1 refills | Status: DC

## 2020-11-19 NOTE — Telephone Encounter (Signed)
rx sent in for zoloft.

## 2020-11-23 ENCOUNTER — Other Ambulatory Visit: Payer: Self-pay

## 2020-11-23 ENCOUNTER — Encounter: Payer: Self-pay | Admitting: Gastroenterology

## 2020-12-03 ENCOUNTER — Other Ambulatory Visit
Admission: RE | Admit: 2020-12-03 | Discharge: 2020-12-03 | Disposition: A | Discharge status: Home / Self Care | Payer: BC Managed Care – PPO | Source: Ambulatory Visit | Attending: Gastroenterology | Admitting: Gastroenterology

## 2020-12-03 ENCOUNTER — Other Ambulatory Visit: Payer: Self-pay

## 2020-12-03 DIAGNOSIS — Z20822 Contact with and (suspected) exposure to covid-19: Secondary | ICD-10-CM | POA: Insufficient documentation

## 2020-12-03 DIAGNOSIS — Z01812 Encounter for preprocedural laboratory examination: Secondary | ICD-10-CM | POA: Insufficient documentation

## 2020-12-03 LAB — SARS CORONAVIRUS 2 (TAT 6-24 HRS): SARS Coronavirus 2: NEGATIVE

## 2020-12-07 ENCOUNTER — Ambulatory Visit
Admission: RE | Disposition: A | Discharge status: Home / Self Care | Payer: Self-pay | Source: Home / Self Care | Attending: Gastroenterology

## 2020-12-07 ENCOUNTER — Ambulatory Visit
Admission: RE | Admit: 2020-12-07 | Discharge: 2020-12-07 | Disposition: A | Discharge status: Home / Self Care | Payer: BC Managed Care – PPO | Attending: Gastroenterology | Admitting: Gastroenterology

## 2020-12-07 ENCOUNTER — Encounter: Payer: Self-pay | Admitting: Anesthesiology

## 2020-12-07 ENCOUNTER — Other Ambulatory Visit: Payer: Self-pay

## 2020-12-07 ENCOUNTER — Encounter: Payer: Self-pay | Admitting: Gastroenterology

## 2020-12-07 DIAGNOSIS — Z1211 Encounter for screening for malignant neoplasm of colon: Secondary | ICD-10-CM | POA: Diagnosis not present

## 2020-12-07 DIAGNOSIS — Z801 Family history of malignant neoplasm of trachea, bronchus and lung: Secondary | ICD-10-CM | POA: Diagnosis not present

## 2020-12-07 DIAGNOSIS — Z833 Family history of diabetes mellitus: Secondary | ICD-10-CM | POA: Diagnosis not present

## 2020-12-07 DIAGNOSIS — Z800 Family history of malignant neoplasm of digestive organs: Secondary | ICD-10-CM | POA: Insufficient documentation

## 2020-12-07 DIAGNOSIS — Z79899 Other long term (current) drug therapy: Secondary | ICD-10-CM | POA: Diagnosis not present

## 2020-12-07 DIAGNOSIS — Z8249 Family history of ischemic heart disease and other diseases of the circulatory system: Secondary | ICD-10-CM | POA: Insufficient documentation

## 2020-12-07 DIAGNOSIS — Z91048 Other nonmedicinal substance allergy status: Secondary | ICD-10-CM | POA: Insufficient documentation

## 2020-12-07 DIAGNOSIS — Z8371 Family history of colonic polyps: Secondary | ICD-10-CM

## 2020-12-07 HISTORY — PX: COLONOSCOPY WITH PROPOFOL: SHX5780

## 2020-12-07 HISTORY — DX: Presence of spectacles and contact lenses: Z97.3

## 2020-12-07 SURGERY — COLONOSCOPY WITH PROPOFOL
Anesthesia: Monitor Anesthesia Care | Site: Rectum

## 2020-12-07 MED ORDER — PROPOFOL 10 MG/ML IV BOLUS
INTRAVENOUS | Status: DC | PRN
  Administered 2020-12-07: 140 mg via INTRAVENOUS
  Administered 2020-12-07: 40 mg via INTRAVENOUS
  Administered 2020-12-07: 50 mg via INTRAVENOUS
  Administered 2020-12-07: 40 mg via INTRAVENOUS

## 2020-12-07 MED ORDER — LACTATED RINGERS IV SOLN: INTRAVENOUS | Status: DC

## 2020-12-07 MED ORDER — STERILE WATER FOR IRRIGATION IR SOLN: Status: DC | PRN

## 2020-12-07 SURGICAL SUPPLY — 6 items
GOWN CVR UNV OPN BCK APRN NK (MISCELLANEOUS) ×2 IMPLANT
GOWN ISOL THUMB LOOP REG UNIV (MISCELLANEOUS) ×4
KIT PRC NS LF DISP ENDO (KITS) ×1 IMPLANT
KIT PROCEDURE OLYMPUS (KITS) ×2
MANIFOLD NEPTUNE II (INSTRUMENTS) ×2 IMPLANT
WATER STERILE IRR 250ML POUR (IV SOLUTION) ×2 IMPLANT

## 2020-12-07 NOTE — Op Note (Addendum)
[facility] Gastroenterology Patient Name: Lorraine Morgan Procedure Date: 12/07/2020 11:25 AM MRN: [id number] Account #: [id number] Date of Birth: 05-Jun-1971 Admit Type: Outpatient Age: 50 Room: [facility] OR ROOM 01 Gender: Female Note Status: Finalized Procedure:             Colonoscopy Indications:           Family history of advanced adenoma of the colon in a                         first-degree relative Providers:             [name] MD, MD Referring MD:          Dale [place], MD (Referring MD) Medicines:             Propofol per Anesthesia Complications:         No immediate complications. Procedure:             Pre-Anesthesia Assessment:                        - Prior to the procedure, a History and Physical was                         performed, and patient medications and allergies were                         reviewed. The patient's tolerance of previous                         anesthesia was also reviewed. The risks and benefits                         of the procedure and the sedation options and risks                         were discussed with the patient. All questions were                         answered, and informed consent was obtained. Prior                         Anticoagulants: The patient has taken no previous                         anticoagulant or antiplatelet agents. ASA Grade                         Assessment: II - A patient with mild systemic disease.                         After reviewing the risks and benefits, the patient                         was deemed in satisfactory condition to undergo the                         procedure.  After obtaining informed consent, the colonoscope was                         passed under direct vision. Throughout the procedure,                         the patient's blood pressure, pulse, and oxygen                         saturations were monitored continuously. The                          Colonoscope was introduced through the anus and                         advanced to the the cecum, identified by appendiceal                         orifice and ileocecal valve. The colonoscopy was                         performed without difficulty. The patient tolerated                         the procedure well. The quality of the bowel                         preparation was excellent. Findings:      The perianal and digital rectal examinations were normal.      The colon (entire examined portion) appeared normal. Impression:            - The entire examined colon is normal.                        - No specimens collected. Recommendation:        - Discharge patient to home.                        - Resume previous diet.                        - Continue present medications.                        - Repeat colonoscopy in 5 years for surveillance. Procedure Code(s):     --- Professional ---                        [phone], Colonoscopy, flexible; diagnostic, including                         collection of specimen(s) by brushing or washing, when                         performed (separate procedure) Diagnosis Code(s):     --- Professional ---                        Z83.71, Family history of colonic polyps CPT copyright 2019 American Medical Association. All rights reserved. The codes documented in this  report are preliminary and upon coder review may  be revised to meet current compliance requirements. [name] MD, MD 12/07/2020 12:13:23 PM This report has been signed electronically. Number of Addenda: 0 Note Initiated On: 12/07/2020 11:25 AM Scope Withdrawal Time: 0 hours 9 minutes 30 seconds  Total Procedure Duration: 0 hours 13 minutes 28 seconds  Estimated Blood Loss:  Estimated blood loss: none.      [facility]

## 2020-12-07 NOTE — Transfer of Care (Signed)
Immediate Anesthesia Transfer of Care Note  Patient: Lorraine Morgan  Procedure(s) Performed: COLONOSCOPY WITH PROPOFOL (N/A Rectum)  Patient Location: PACU  Anesthesia Type: MAC  Level of Consciousness: awake, alert  and patient cooperative  Airway and Oxygen Therapy: Patient Spontanous Breathing and Patient connected to supplemental oxygen  Post-op Assessment: Post-op Vital signs reviewed, Patient's Cardiovascular Status Stable, Respiratory Function Stable, Patent Airway and No signs of Nausea or vomiting  Post-op Vital Signs: Reviewed and stable  Complications: No complications documented.

## 2020-12-07 NOTE — Anesthesia Postprocedure Evaluation (Signed)
Anesthesia Post Note  Patient: Lorraine Morgan  Procedure(s) Performed: COLONOSCOPY WITH PROPOFOL (N/A Rectum)     Patient location during evaluation: PACU Anesthesia Type: MAC Level of consciousness: awake and alert Pain management: pain level controlled Vital Signs Assessment: post-procedure vital signs reviewed and stable Respiratory status: nonlabored ventilation and spontaneous breathing Cardiovascular status: blood pressure returned to baseline Postop Assessment: no apparent nausea or vomiting Anesthetic complications: no   No complications documented.  Yanci Bachtell [employer]
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
## 2020-12-07 NOTE — Anesthesia Preprocedure Evaluation (Signed)
Anesthesia Evaluation  Patient identified by MRN, date of birth, ID band Patient awake    Reviewed: Allergy & Precautions, NPO status , Patient's Chart, lab work & pertinent test results  History of Anesthesia Complications (+) PONV and history of anesthetic complications  Airway Mallampati: II  TM Distance: >3 FB Neck ROM: full    Dental no notable dental hx.    Pulmonary neg pulmonary ROS,    Pulmonary exam normal        Cardiovascular Normal cardiovascular exam+ dysrhythmias (occas irreg beat - )      Neuro/Psych PSYCHIATRIC DISORDERS Anxiety negative neurological ROS     GI/Hepatic Neg liver ROS, GERD  Medicated,Family hx of colon polyps    Endo/Other  negative endocrine ROS  Renal/GU negative Renal ROS  negative genitourinary   Musculoskeletal   Abdominal Normal abdominal exam  (+)   Peds  Hematology negative hematology ROS (+)   Anesthesia Other Findings   Reproductive/Obstetrics TAH                              Anesthesia Physical  Anesthesia Plan  ASA: II  Anesthesia Plan: MAC   Post-op Pain Management:    Induction:   PONV Risk Score and Plan: 3 and TIVA, Midazolam and Treatment may vary due to age or medical condition  Airway Management Planned: Natural Airway and Nasal Cannula  Additional Equipment: None  Intra-op Plan:   Post-operative Plan:   Informed Consent: I have reviewed the patients History and Physical, chart, labs and discussed the procedure including the risks, benefits and alternatives for the proposed anesthesia with the patient or authorized representative who has indicated his/her understanding and acceptance.     Dental advisory given  Plan Discussed with: CRNA  Anesthesia Plan Comments:         Anesthesia Quick Evaluation

## 2020-12-07 NOTE — Anesthesia Procedure Notes (Signed)
Date/Time: 12/07/2020 11:56 AM Performed by: Maree Krabbe, CRNA Pre-anesthesia Checklist: Patient identified, Emergency Drugs available, Suction available, Timeout performed and Patient being monitored Patient Re-evaluated:Patient Re-evaluated prior to induction Oxygen Delivery Method: Nasal cannula Placement Confirmation: positive ETCO2

## 2020-12-08 ENCOUNTER — Encounter: Payer: Self-pay | Admitting: Gastroenterology

## 2021-01-01 ENCOUNTER — Ambulatory Visit: Payer: BC Managed Care – PPO | Admitting: Internal Medicine

## 2021-01-01 ENCOUNTER — Other Ambulatory Visit: Payer: Self-pay

## 2021-01-01 ENCOUNTER — Ambulatory Visit
Admission: RE | Admit: 2021-01-01 | Discharge: 2021-01-01 | Disposition: A | Discharge status: Home / Self Care | Payer: BC Managed Care – PPO | Source: Ambulatory Visit | Attending: Obstetrics and Gynecology | Admitting: Obstetrics and Gynecology

## 2021-01-01 DIAGNOSIS — F411 Generalized anxiety disorder: Secondary | ICD-10-CM

## 2021-01-01 DIAGNOSIS — Z1231 Encounter for screening mammogram for malignant neoplasm of breast: Secondary | ICD-10-CM | POA: Insufficient documentation

## 2021-01-01 DIAGNOSIS — E7800 Pure hypercholesterolemia, unspecified: Secondary | ICD-10-CM | POA: Diagnosis not present

## 2021-01-01 DIAGNOSIS — M5489 Other dorsalgia: Secondary | ICD-10-CM

## 2021-01-01 DIAGNOSIS — Z1239 Encounter for other screening for malignant neoplasm of breast: Secondary | ICD-10-CM

## 2021-01-01 DIAGNOSIS — G479 Sleep disorder, unspecified: Secondary | ICD-10-CM

## 2021-01-01 DIAGNOSIS — R7989 Other specified abnormal findings of blood chemistry: Secondary | ICD-10-CM

## 2021-01-01 DIAGNOSIS — K219 Gastro-esophageal reflux disease without esophagitis: Secondary | ICD-10-CM

## 2021-01-01 DIAGNOSIS — Z01419 Encounter for gynecological examination (general) (routine) without abnormal findings: Secondary | ICD-10-CM | POA: Diagnosis present

## 2021-01-01 LAB — TSH: TSH: 0.36 u[IU]/mL (ref 0.35–4.50)

## 2021-01-01 LAB — T3, FREE: T3, Free: 3.2 pg/mL (ref 2.3–4.2)

## 2021-01-01 LAB — T4, FREE: Free T4: 0.85 ng/dL (ref 0.60–1.60)

## 2021-01-01 MED ORDER — SERTRALINE HCL 50 MG PO TABS: 50.0000 mg | ORAL_TABLET | Freq: Every day | ORAL | 3 refills | Status: DC
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
## 2021-01-02 ENCOUNTER — Encounter: Payer: Self-pay | Admitting: Internal Medicine

## 2021-01-02 DIAGNOSIS — M549 Dorsalgia, unspecified: Secondary | ICD-10-CM | POA: Insufficient documentation

## 2021-01-02 MED ORDER — CYCLOBENZAPRINE HCL 5 MG PO TABS: 5.0000 mg | ORAL_TABLET | Freq: Every evening | ORAL | 0 refills | Status: AC | PRN

## 2021-01-02 NOTE — Assessment & Plan Note (Signed)
On zoloft 25mg  q day.  Cannot tell a bid difference on this medication.  Will increase to 50mg  q day.  Follow.

## 2021-01-02 NOTE — Assessment & Plan Note (Signed)
Recheck tsh and free T4 and free T3.

## 2021-01-02 NOTE — Assessment & Plan Note (Signed)
Off trazodone.  Increase zoloft to 50mg  q day.  Follow.

## 2021-01-02 NOTE — Assessment & Plan Note (Signed)
Back spasms as outlined.  Improved.  Refill flexeril.  Follow.

## 2021-01-02 NOTE — Assessment & Plan Note (Signed)
No upper symptoms.  Protonix.  

## 2021-01-02 NOTE — Assessment & Plan Note (Signed)
Low cholesterol diet and exercise.  Follow lipid panel.   

## 2021-01-31 ENCOUNTER — Other Ambulatory Visit: Payer: Self-pay | Admitting: Internal Medicine

## 2021-03-05 ENCOUNTER — Other Ambulatory Visit: Payer: Self-pay

## 2021-03-05 ENCOUNTER — Ambulatory Visit: Payer: BC Managed Care – PPO | Admitting: Internal Medicine

## 2021-03-05 DIAGNOSIS — K219 Gastro-esophageal reflux disease without esophagitis: Secondary | ICD-10-CM | POA: Diagnosis not present

## 2021-03-05 DIAGNOSIS — F411 Generalized anxiety disorder: Secondary | ICD-10-CM | POA: Diagnosis not present

## 2021-03-05 DIAGNOSIS — M79642 Pain in left hand: Secondary | ICD-10-CM

## 2021-03-05 DIAGNOSIS — E7800 Pure hypercholesterolemia, unspecified: Secondary | ICD-10-CM | POA: Diagnosis not present
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
## 2021-03-14 ENCOUNTER — Encounter: Payer: Self-pay | Admitting: Internal Medicine

## 2021-03-14 DIAGNOSIS — M79642 Pain in left hand: Secondary | ICD-10-CM | POA: Insufficient documentation

## 2021-03-14 DIAGNOSIS — M79643 Pain in unspecified hand: Secondary | ICD-10-CM | POA: Insufficient documentation

## 2021-03-14 NOTE — Assessment & Plan Note (Signed)
No upper symptoms reported.  On protonix.   

## 2021-03-14 NOTE — Assessment & Plan Note (Signed)
Hand and wrist pain as outlined.  Worse recently.  Types a lot.  She is using a brace prn.  Taking advil/tylenol prn.  Discussed ortho referral.  She will call and schedule.  Will let me know if referral is needed.

## 2021-03-14 NOTE — Assessment & Plan Note (Signed)
Continue zoloft.  Overall stable. Does not feel needs any further intervention.  Follow.

## 2021-03-14 NOTE — Assessment & Plan Note (Signed)
Low cholesterol diet and exercise.  Follow lipid panel.   

## 2021-06-10 ENCOUNTER — Encounter: Payer: Self-pay | Admitting: Internal Medicine

## 2021-06-10 ENCOUNTER — Ambulatory Visit: Payer: BC Managed Care – PPO | Admitting: Internal Medicine

## 2021-06-10 ENCOUNTER — Other Ambulatory Visit: Payer: Self-pay

## 2021-06-10 VITALS — BP 118/72 | HR 77 | Temp 98.2°F | Resp 16 | Ht 60.0 in | Wt 148.6 lb

## 2021-06-10 DIAGNOSIS — K219 Gastro-esophageal reflux disease without esophagitis: Secondary | ICD-10-CM

## 2021-06-10 DIAGNOSIS — G479 Sleep disorder, unspecified: Secondary | ICD-10-CM

## 2021-06-10 DIAGNOSIS — M79645 Pain in left finger(s): Secondary | ICD-10-CM

## 2021-06-10 DIAGNOSIS — Z114 Encounter for screening for human immunodeficiency virus [HIV]: Secondary | ICD-10-CM | POA: Diagnosis not present

## 2021-06-10 DIAGNOSIS — R7989 Other specified abnormal findings of blood chemistry: Secondary | ICD-10-CM | POA: Diagnosis not present

## 2021-06-10 DIAGNOSIS — Z1159 Encounter for screening for other viral diseases: Secondary | ICD-10-CM | POA: Diagnosis not present

## 2021-06-10 DIAGNOSIS — E7800 Pure hypercholesterolemia, unspecified: Secondary | ICD-10-CM

## 2021-06-10 DIAGNOSIS — Z13220 Encounter for screening for lipoid disorders: Secondary | ICD-10-CM | POA: Diagnosis not present

## 2021-06-10 DIAGNOSIS — F411 Generalized anxiety disorder: Secondary | ICD-10-CM

## 2021-06-10 LAB — COMPREHENSIVE METABOLIC PANEL
ALT: 10 U/L (ref 0–35)
AST: 16 U/L (ref 0–37)
Albumin: 4.1 g/dL (ref 3.5–5.2)
Alkaline Phosphatase: 88 U/L (ref 39–117)
BUN: 20 mg/dL (ref 6–23)
CO2: 28 mEq/L (ref 19–32)
Calcium: 9.5 mg/dL (ref 8.4–10.5)
Chloride: 104 mEq/L (ref 96–112)
Creatinine, Ser: 0.86 mg/dL (ref 0.40–1.20)
GFR: 80.24 mL/min (ref >60.00)
Glucose, Bld: 84 mg/dL (ref 70–99)
Potassium: 4.2 mEq/L (ref 3.5–5.1)
Sodium: 141 mEq/L (ref 135–145)
Total Bilirubin: 0.3 mg/dL (ref 0.2–1.2)
Total Protein: 6.7 g/dL (ref 6.0–8.3)

## 2021-06-10 LAB — LIPID PANEL
Cholesterol: 202 mg/dL — ABNORMAL HIGH (ref 0–200)
HDL: 62.6 mg/dL (ref >39.00)
LDL Cholesterol: 120 mg/dL — ABNORMAL HIGH (ref 0–99)
NonHDL: 139.06
Total CHOL/HDL Ratio: 3
Triglycerides: 94 mg/dL (ref 0.0–149.0)
VLDL: 18.8 mg/dL (ref 0.0–40.0)

## 2021-06-10 LAB — TSH: TSH: 0.41 u[IU]/mL (ref 0.35–5.50)

## 2021-06-10 MED ORDER — SERTRALINE HCL 50 MG PO TABS: 50.0000 mg | ORAL_TABLET | Freq: Every day | ORAL | 1 refills | Status: DC

## 2021-06-10 MED ORDER — PANTOPRAZOLE SODIUM 40 MG PO TBEC: DELAYED_RELEASE_TABLET | ORAL | 2 refills | Status: DC
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
## 2021-06-11 LAB — HIV ANTIBODY (ROUTINE TESTING W REFLEX): HIV 1&2 Ab, 4th Generation: NONREACTIVE

## 2021-06-11 LAB — HEPATITIS C ANTIBODY
Hepatitis C Ab: NONREACTIVE
SIGNAL TO CUT-OFF: 0 (ref <1.00)

## 2021-06-20 ENCOUNTER — Encounter: Payer: Self-pay | Admitting: Internal Medicine

## 2021-06-20 DIAGNOSIS — M79645 Pain in left finger(s): Secondary | ICD-10-CM | POA: Insufficient documentation

## 2021-06-20 NOTE — Assessment & Plan Note (Signed)
Has celebrex.  Wearing thumb spica.  Is better.  Follow.

## 2021-06-20 NOTE — Assessment & Plan Note (Signed)
Sleeping better.  Follow.  

## 2021-06-20 NOTE — Assessment & Plan Note (Signed)
Low cholesterol diet and exercise.  Follow lipid panel.   

## 2021-06-20 NOTE — Assessment & Plan Note (Signed)
No upper symptoms reported.  On protonix.   

## 2021-06-20 NOTE — Assessment & Plan Note (Signed)
On zoloft. Doing well on this medication.  Work going well.  Hold on making any changes.  Follow.

## 2021-06-20 NOTE — Assessment & Plan Note (Signed)
Recheck tsh to confirm normal.

## 2021-10-13 ENCOUNTER — Encounter: Payer: Self-pay | Admitting: Internal Medicine

## 2021-10-13 ENCOUNTER — Ambulatory Visit: Payer: BC Managed Care – PPO | Admitting: Internal Medicine

## 2021-10-13 ENCOUNTER — Other Ambulatory Visit: Payer: Self-pay

## 2021-10-13 VITALS — BP 122/70 | HR 87 | Temp 98.1°F | Resp 16 | Ht 60.0 in | Wt 151.4 lb

## 2021-10-13 DIAGNOSIS — F411 Generalized anxiety disorder: Secondary | ICD-10-CM

## 2021-10-13 DIAGNOSIS — E7800 Pure hypercholesterolemia, unspecified: Secondary | ICD-10-CM

## 2021-10-13 DIAGNOSIS — M79642 Pain in left hand: Secondary | ICD-10-CM

## 2021-10-13 DIAGNOSIS — M79641 Pain in right hand: Secondary | ICD-10-CM

## 2021-10-13 DIAGNOSIS — K219 Gastro-esophageal reflux disease without esophagitis: Secondary | ICD-10-CM

## 2021-10-13 DIAGNOSIS — G479 Sleep disorder, unspecified: Secondary | ICD-10-CM

## 2021-10-13 LAB — COMPREHENSIVE METABOLIC PANEL
ALT: 14 U/L (ref 0–35)
AST: 14 U/L (ref 0–37)
Albumin: 4.1 g/dL (ref 3.5–5.2)
Alkaline Phosphatase: 79 U/L (ref 39–117)
BUN: 16 mg/dL (ref 6–23)
CO2: 27 mEq/L (ref 19–32)
Calcium: 9.6 mg/dL (ref 8.4–10.5)
Chloride: 105 mEq/L (ref 96–112)
Creatinine, Ser: 0.81 mg/dL (ref 0.40–1.20)
GFR: 86.01 mL/min (ref >60.00)
Glucose, Bld: 108 mg/dL — ABNORMAL HIGH (ref 70–99)
Potassium: 4.1 mEq/L (ref 3.5–5.1)
Sodium: 140 mEq/L (ref 135–145)
Total Bilirubin: 0.5 mg/dL (ref 0.2–1.2)
Total Protein: 7 g/dL (ref 6.0–8.3)

## 2021-10-13 LAB — LIPID PANEL
Cholesterol: 201 mg/dL — ABNORMAL HIGH (ref 0–200)
HDL: 64.9 mg/dL (ref >39.00)
LDL Cholesterol: 120 mg/dL — ABNORMAL HIGH (ref 0–99)
NonHDL: 136.18
Total CHOL/HDL Ratio: 3
Triglycerides: 83 mg/dL (ref 0.0–149.0)
VLDL: 16.6 mg/dL (ref 0.0–40.0)

## 2021-10-13 LAB — CBC WITH DIFFERENTIAL/PLATELET
Basophils Absolute: 0 10*3/uL (ref 0.0–0.1)
Basophils Relative: 0.4 % (ref 0.0–3.0)
Eosinophils Absolute: 0.1 10*3/uL (ref 0.0–0.7)
Eosinophils Relative: 1.1 % (ref 0.0–5.0)
HCT: 36.8 % (ref 36.0–46.0)
Hemoglobin: 12.1 g/dL (ref 12.0–15.0)
Lymphocytes Relative: 47.1 % — ABNORMAL HIGH (ref 12.0–46.0)
Lymphs Abs: 2.2 10*3/uL (ref 0.7–4.0)
MCHC: 32.9 g/dL (ref 30.0–36.0)
MCV: 89.9 fl (ref 78.0–100.0)
Monocytes Absolute: 0.5 10*3/uL (ref 0.1–1.0)
Monocytes Relative: 11 % (ref 3.0–12.0)
Neutro Abs: 1.9 10*3/uL (ref 1.4–7.7)
Neutrophils Relative %: 40.4 % — ABNORMAL LOW (ref 43.0–77.0)
Platelets: 196 10*3/uL (ref 150.0–400.0)
RBC: 4.09 Mil/uL (ref 3.87–5.11)
RDW: 13.5 % (ref 11.5–15.5)
WBC: 4.7 10*3/uL (ref 4.0–10.5)

## 2021-10-13 MED ORDER — SERTRALINE HCL 50 MG PO TABS: 50.0000 mg | ORAL_TABLET | Freq: Every day | ORAL | 1 refills | Status: DC
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
## 2021-10-19 ENCOUNTER — Encounter: Payer: Self-pay | Admitting: Internal Medicine

## 2021-10-19 NOTE — Assessment & Plan Note (Signed)
Low cholesterol diet and exercise.  Follow lipid panel.   

## 2021-10-19 NOTE — Assessment & Plan Note (Signed)
Discussed timing of trazodone as outlined.  Follow.

## 2021-10-19 NOTE — Assessment & Plan Note (Signed)
On zoloft. Doing well on this medication.  Continue.  Discussed taking trazodone earlier - to help with sleep and to hopefully avoid increased daytime fatigue/sleepiness.

## 2021-10-19 NOTE — Assessment & Plan Note (Signed)
No upper symptoms reported.  On protonix.   

## 2021-10-19 NOTE — Assessment & Plan Note (Signed)
Has had hand and wrist pain.  Saw ortho.  Consider f/u.

## 2022-01-07 ENCOUNTER — Other Ambulatory Visit: Payer: Self-pay | Admitting: Internal Medicine

## 2022-02-05 ENCOUNTER — Encounter: Payer: Self-pay | Admitting: Internal Medicine

## 2022-02-07 NOTE — Telephone Encounter (Signed)
Yes I can do a pap smear during her next appt.  I sent her a my chart message letting her know.  Please make a note on the schedule - for pap smear ?

## 2022-02-09 ENCOUNTER — Other Ambulatory Visit: Payer: Self-pay | Admitting: Internal Medicine

## 2022-02-09 DIAGNOSIS — Z1231 Encounter for screening mammogram for malignant neoplasm of breast: Secondary | ICD-10-CM

## 2022-02-11 ENCOUNTER — Encounter: Payer: Self-pay | Admitting: Internal Medicine

## 2022-02-11 ENCOUNTER — Ambulatory Visit: Payer: BC Managed Care – PPO | Admitting: Internal Medicine

## 2022-02-11 ENCOUNTER — Other Ambulatory Visit (HOSPITAL_COMMUNITY)
Admission: RE | Admit: 2022-02-11 | Discharge: 2022-02-11 | Disposition: A | Discharge status: Home / Self Care | Payer: BC Managed Care – PPO | Source: Ambulatory Visit | Attending: Internal Medicine | Admitting: Internal Medicine

## 2022-02-11 ENCOUNTER — Ambulatory Visit
Admission: RE | Admit: 2022-02-11 | Discharge: 2022-02-11 | Disposition: A | Discharge status: Home / Self Care | Payer: BC Managed Care – PPO | Source: Ambulatory Visit | Attending: Internal Medicine | Admitting: Internal Medicine

## 2022-02-11 VITALS — BP 126/70 | HR 62 | Temp 98.2°F | Resp 15 | Ht 60.0 in | Wt 154.6 lb

## 2022-02-11 DIAGNOSIS — Z1231 Encounter for screening mammogram for malignant neoplasm of breast: Secondary | ICD-10-CM | POA: Insufficient documentation

## 2022-02-11 DIAGNOSIS — K219 Gastro-esophageal reflux disease without esophagitis: Secondary | ICD-10-CM

## 2022-02-11 DIAGNOSIS — F411 Generalized anxiety disorder: Secondary | ICD-10-CM | POA: Diagnosis not present

## 2022-02-11 DIAGNOSIS — E7800 Pure hypercholesterolemia, unspecified: Secondary | ICD-10-CM | POA: Diagnosis not present

## 2022-02-11 DIAGNOSIS — Z124 Encounter for screening for malignant neoplasm of cervix: Secondary | ICD-10-CM | POA: Insufficient documentation

## 2022-02-11 DIAGNOSIS — G479 Sleep disorder, unspecified: Secondary | ICD-10-CM

## 2022-02-11 DIAGNOSIS — R7989 Other specified abnormal findings of blood chemistry: Secondary | ICD-10-CM

## 2022-02-11 LAB — COMPREHENSIVE METABOLIC PANEL
ALT: 11 U/L (ref 0–35)
AST: 18 U/L (ref 0–37)
Albumin: 4.3 g/dL (ref 3.5–5.2)
Alkaline Phosphatase: 92 U/L (ref 39–117)
BUN: 17 mg/dL (ref 6–23)
CO2: 28 mEq/L (ref 19–32)
Calcium: 9.2 mg/dL (ref 8.4–10.5)
Chloride: 105 mEq/L (ref 96–112)
Creatinine, Ser: 0.76 mg/dL (ref 0.40–1.20)
GFR: 92.63 mL/min (ref >60.00)
Glucose, Bld: 94 mg/dL (ref 70–99)
Potassium: 4.3 mEq/L (ref 3.5–5.1)
Sodium: 139 mEq/L (ref 135–145)
Total Bilirubin: 0.3 mg/dL (ref 0.2–1.2)
Total Protein: 7.2 g/dL (ref 6.0–8.3)

## 2022-02-11 LAB — LIPID PANEL
Cholesterol: 207 mg/dL — ABNORMAL HIGH (ref 0–200)
HDL: 64.8 mg/dL (ref >39.00)
LDL Cholesterol: 129 mg/dL — ABNORMAL HIGH (ref 0–99)
NonHDL: 142.51
Total CHOL/HDL Ratio: 3
Triglycerides: 70 mg/dL (ref 0.0–149.0)
VLDL: 14 mg/dL (ref 0.0–40.0)

## 2022-02-11 LAB — TSH: TSH: 0.42 u[IU]/mL (ref 0.35–5.50)

## 2022-02-11 MED ORDER — CELECOXIB 100 MG PO CAPS: 100.0000 mg | ORAL_CAPSULE | Freq: Every day | ORAL | 1 refills | Status: DC

## 2022-02-11 MED ORDER — PANTOPRAZOLE SODIUM 40 MG PO TBEC: DELAYED_RELEASE_TABLET | ORAL | 2 refills | Status: DC

## 2022-02-11 MED ORDER — SERTRALINE HCL 50 MG PO TABS: 50.0000 mg | ORAL_TABLET | Freq: Every day | ORAL | 1 refills | Status: DC
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
## 2022-02-16 LAB — CYTOLOGY - PAP
Adequacy: ABSENT
Comment: NEGATIVE
Diagnosis: NEGATIVE
High risk HPV: NEGATIVE

## 2022-02-20 DIAGNOSIS — Z124 Encounter for screening for malignant neoplasm of cervix: Secondary | ICD-10-CM | POA: Insufficient documentation

## 2022-02-20 NOTE — Assessment & Plan Note (Signed)
Recheck tsh to confirm normal.  

## 2022-02-20 NOTE — Assessment & Plan Note (Signed)
No upper symptoms reported.  On protonix.   

## 2022-02-20 NOTE — Assessment & Plan Note (Signed)
-   PAP today

## 2022-02-20 NOTE — Assessment & Plan Note (Signed)
Low cholesterol diet and exercise.  Follow lipid panel.   

## 2022-02-20 NOTE — Assessment & Plan Note (Signed)
Stress is better.  On zoloft.  Doing well.  Follow.  ?

## 2022-02-20 NOTE — Assessment & Plan Note (Signed)
Doing better.  Not needing trazodone regularly.  Follow.  

## 2022-06-13 ENCOUNTER — Ambulatory Visit: Payer: BC Managed Care – PPO | Admitting: Internal Medicine

## 2022-07-07 ENCOUNTER — Ambulatory Visit: Payer: BC Managed Care – PPO | Admitting: Internal Medicine

## 2022-07-07 ENCOUNTER — Encounter: Payer: Self-pay | Admitting: Internal Medicine

## 2022-07-07 DIAGNOSIS — F411 Generalized anxiety disorder: Secondary | ICD-10-CM

## 2022-07-07 DIAGNOSIS — M2550 Pain in unspecified joint: Secondary | ICD-10-CM

## 2022-07-07 DIAGNOSIS — M79645 Pain in left finger(s): Secondary | ICD-10-CM | POA: Diagnosis not present

## 2022-07-07 DIAGNOSIS — K219 Gastro-esophageal reflux disease without esophagitis: Secondary | ICD-10-CM | POA: Diagnosis not present

## 2022-07-07 DIAGNOSIS — E7800 Pure hypercholesterolemia, unspecified: Secondary | ICD-10-CM

## 2022-07-07 MED ORDER — SERTRALINE HCL 100 MG PO TABS: 100.0000 mg | ORAL_TABLET | Freq: Every day | ORAL | 1 refills | Status: DC
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
## 2022-07-08 LAB — HEPATIC FUNCTION PANEL
ALT: 14 U/L (ref 0–35)
AST: 18 U/L (ref 0–37)
Albumin: 4.2 g/dL (ref 3.5–5.2)
Alkaline Phosphatase: 88 U/L (ref 39–117)
Bilirubin, Direct: 0.1 mg/dL (ref 0.0–0.3)
Total Bilirubin: 0.3 mg/dL (ref 0.2–1.2)
Total Protein: 7.4 g/dL (ref 6.0–8.3)

## 2022-07-08 LAB — BASIC METABOLIC PANEL
BUN: 12 mg/dL (ref 6–23)
CO2: 28 mEq/L (ref 19–32)
Calcium: 9.4 mg/dL (ref 8.4–10.5)
Chloride: 103 mEq/L (ref 96–112)
Creatinine, Ser: 0.79 mg/dL (ref 0.40–1.20)
GFR: 88.18 mL/min (ref >60.00)
Glucose, Bld: 86 mg/dL (ref 70–99)
Potassium: 4.1 mEq/L (ref 3.5–5.1)
Sodium: 140 mEq/L (ref 135–145)

## 2022-07-08 LAB — CBC WITH DIFFERENTIAL/PLATELET
Basophils Absolute: 0.1 10*3/uL (ref 0.0–0.1)
Basophils Relative: 1.6 % (ref 0.0–3.0)
Eosinophils Absolute: 0.1 10*3/uL (ref 0.0–0.7)
Eosinophils Relative: 1 % (ref 0.0–5.0)
HCT: 38.5 % (ref 36.0–46.0)
Hemoglobin: 12.6 g/dL (ref 12.0–15.0)
Lymphocytes Relative: 45.8 % (ref 12.0–46.0)
Lymphs Abs: 2.4 10*3/uL (ref 0.7–4.0)
MCHC: 32.7 g/dL (ref 30.0–36.0)
MCV: 91.2 fl (ref 78.0–100.0)
Monocytes Absolute: 0.5 10*3/uL (ref 0.1–1.0)
Monocytes Relative: 9.7 % (ref 3.0–12.0)
Neutro Abs: 2.2 10*3/uL (ref 1.4–7.7)
Neutrophils Relative %: 41.9 % — ABNORMAL LOW (ref 43.0–77.0)
Platelets: 208 10*3/uL (ref 150.0–400.0)
RBC: 4.23 Mil/uL (ref 3.87–5.11)
RDW: 13.8 % (ref 11.5–15.5)
WBC: 5.3 10*3/uL (ref 4.0–10.5)

## 2022-07-08 LAB — C-REACTIVE PROTEIN: CRP: <1 mg/dL (ref 0.5–20.0)

## 2022-07-08 LAB — TSH: TSH: 0.29 u[IU]/mL — ABNORMAL LOW (ref 0.35–5.50)

## 2022-07-08 LAB — SEDIMENTATION RATE: Sed Rate: 31 mm/hr — ABNORMAL HIGH (ref 0–20)

## 2022-07-10 LAB — ANA: Anti Nuclear Antibody (ANA): NEGATIVE

## 2022-07-10 LAB — RHEUMATOID FACTOR: Rheumatoid fact SerPl-aCnc: <14 IU/mL (ref <14)

## 2022-07-13 ENCOUNTER — Other Ambulatory Visit: Payer: Self-pay | Admitting: Internal Medicine

## 2022-07-13 ENCOUNTER — Telehealth: Payer: Self-pay

## 2022-07-13 ENCOUNTER — Other Ambulatory Visit: Payer: Self-pay

## 2022-07-13 ENCOUNTER — Encounter: Payer: Self-pay | Admitting: Internal Medicine

## 2022-07-13 DIAGNOSIS — R7989 Other specified abnormal findings of blood chemistry: Secondary | ICD-10-CM

## 2022-07-13 DIAGNOSIS — M2550 Pain in unspecified joint: Secondary | ICD-10-CM

## 2022-07-13 NOTE — Assessment & Plan Note (Signed)
Low cholesterol diet and exercise.  Follow lipid panel.   

## 2022-07-13 NOTE — Progress Notes (Signed)
Order placed for f/u labs.  

## 2022-07-13 NOTE — Telephone Encounter (Signed)
LMTCB for lab results.  

## 2022-07-13 NOTE — Assessment & Plan Note (Signed)
No upper symptoms reported.  On protonix.   

## 2022-07-13 NOTE — Assessment & Plan Note (Signed)
Increased joint pain as outlined.  Discussed.  Check labs, including ESR, RA and ANA.  Family history of RA.   

## 2022-07-13 NOTE — Assessment & Plan Note (Signed)
Discussed increased stress as outlined.  On zoloft.  Discussed increased dose - 100mg  q day.  Follow.

## 2022-07-13 NOTE — Assessment & Plan Note (Addendum)
Has celebrex.  Thumb spica. Increased joint pains.  Check labs as outlined.

## 2022-07-19 ENCOUNTER — Other Ambulatory Visit: Payer: Self-pay | Admitting: Internal Medicine

## 2022-07-20 ENCOUNTER — Other Ambulatory Visit (INDEPENDENT_AMBULATORY_CARE_PROVIDER_SITE_OTHER): Payer: BC Managed Care – PPO

## 2022-07-20 DIAGNOSIS — R7989 Other specified abnormal findings of blood chemistry: Secondary | ICD-10-CM

## 2022-07-21 LAB — T4, FREE: Free T4: 0.67 ng/dL (ref 0.60–1.60)

## 2022-07-21 LAB — TSH: TSH: 0.3 u[IU]/mL — ABNORMAL LOW (ref 0.35–5.50)

## 2022-07-21 LAB — T3, FREE: T3, Free: 2.8 pg/mL (ref 2.3–4.2)

## 2022-10-01 ENCOUNTER — Other Ambulatory Visit: Payer: Self-pay | Admitting: Internal Medicine

## 2022-10-05 NOTE — Telephone Encounter (Signed)
Please confirm if still needs and how often taking.

## 2022-10-09 ENCOUNTER — Other Ambulatory Visit: Payer: Self-pay | Admitting: Internal Medicine

## 2022-10-09 NOTE — Telephone Encounter (Signed)
Order placed for celebrex #30 with no refills

## 2022-10-14 ENCOUNTER — Ambulatory Visit: Payer: BC Managed Care – PPO | Admitting: Internal Medicine

## 2022-10-31 ENCOUNTER — Encounter: Payer: Self-pay | Admitting: Internal Medicine

## 2022-10-31 ENCOUNTER — Ambulatory Visit: Payer: BC Managed Care – PPO | Admitting: Internal Medicine

## 2022-10-31 VITALS — BP 112/78 | HR 82 | Temp 97.6°F | Ht 60.0 in | Wt 161.6 lb

## 2022-10-31 DIAGNOSIS — M79642 Pain in left hand: Secondary | ICD-10-CM

## 2022-10-31 DIAGNOSIS — K219 Gastro-esophageal reflux disease without esophagitis: Secondary | ICD-10-CM

## 2022-10-31 DIAGNOSIS — R7989 Other specified abnormal findings of blood chemistry: Secondary | ICD-10-CM | POA: Diagnosis not present

## 2022-10-31 DIAGNOSIS — M79641 Pain in right hand: Secondary | ICD-10-CM

## 2022-10-31 DIAGNOSIS — F411 Generalized anxiety disorder: Secondary | ICD-10-CM | POA: Diagnosis not present

## 2022-10-31 DIAGNOSIS — E7800 Pure hypercholesterolemia, unspecified: Secondary | ICD-10-CM

## 2022-10-31 MED ORDER — SERTRALINE HCL 100 MG PO TABS: 100.0000 mg | ORAL_TABLET | Freq: Every day | ORAL | 1 refills | Status: DC

## 2022-10-31 MED ORDER — PANTOPRAZOLE SODIUM 40 MG PO TBEC: DELAYED_RELEASE_TABLET | ORAL | 2 refills | Status: DC

## 2022-10-31 NOTE — Patient Instructions (Signed)
Take pepcid (famotidine) 20mg  - 30 minutes before breakfast  Take protonix 30 minutes before your evening meal.    Call with update
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
## 2022-11-06 ENCOUNTER — Encounter: Payer: Self-pay | Admitting: Internal Medicine

## 2022-11-06 NOTE — Assessment & Plan Note (Signed)
Discussed cough at night may be related to acid reflux.  Continue protonix .  Take 30 minutes before evening meal.  Add pepcid in am as directed.  Call with update.

## 2022-11-06 NOTE — Assessment & Plan Note (Signed)
TSH suppressed, but Free T3 and Free T4 wnl.  Follow.

## 2022-11-06 NOTE — Assessment & Plan Note (Signed)
Low cholesterol diet and exercise.  Follow lipid panel.   

## 2022-11-06 NOTE — Assessment & Plan Note (Signed)
Increased stress.  On zoloft.  Appears to be doing well. Follow.

## 2022-11-06 NOTE — Assessment & Plan Note (Signed)
Saw rheumatology.  ANA negative.  RF negative.  Diagnosed OA - celebrex prn.  OT.

## 2023-01-02 ENCOUNTER — Ambulatory Visit: Payer: BC Managed Care – PPO | Admitting: Internal Medicine

## 2023-01-02 VITALS — BP 106/70 | HR 80 | Temp 98.0°F | Resp 16 | Ht 60.0 in | Wt 162.0 lb

## 2023-01-02 DIAGNOSIS — E7800 Pure hypercholesterolemia, unspecified: Secondary | ICD-10-CM

## 2023-01-02 DIAGNOSIS — J392 Other diseases of pharynx: Secondary | ICD-10-CM

## 2023-01-02 DIAGNOSIS — F411 Generalized anxiety disorder: Secondary | ICD-10-CM | POA: Diagnosis not present

## 2023-01-02 DIAGNOSIS — Z1231 Encounter for screening mammogram for malignant neoplasm of breast: Secondary | ICD-10-CM | POA: Diagnosis not present

## 2023-01-02 DIAGNOSIS — K219 Gastro-esophageal reflux disease without esophagitis: Secondary | ICD-10-CM | POA: Diagnosis not present

## 2023-01-02 DIAGNOSIS — G479 Sleep disorder, unspecified: Secondary | ICD-10-CM

## 2023-01-02 MED ORDER — NYSTATIN 100000 UNIT/ML MT SUSP: OROMUCOSAL | 0 refills | Status: DC
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
## 2023-01-08 ENCOUNTER — Encounter: Payer: Self-pay | Admitting: Internal Medicine

## 2023-01-08 DIAGNOSIS — J392 Other diseases of pharynx: Secondary | ICD-10-CM | POA: Insufficient documentation

## 2023-01-08 NOTE — Assessment & Plan Note (Signed)
Low cholesterol diet and exercise.  Follow lipid panel.   

## 2023-01-08 NOTE — Assessment & Plan Note (Signed)
Continue protonix .  Have discussed pepcid.  Feels controlled. Call with update.

## 2023-01-08 NOTE — Assessment & Plan Note (Signed)
Handling stress.  On zoloft.  Appears to be doing well. Follow.

## 2023-01-08 NOTE — Assessment & Plan Note (Signed)
Symptoms as outlined.  Irritation. Feels full when head back.  Trial of nystatin suspension.  Call with update.

## 2023-01-08 NOTE — Assessment & Plan Note (Signed)
Doing better.  Not needing trazodone regularly.  Follow.

## 2023-01-17 ENCOUNTER — Encounter: Payer: Self-pay | Admitting: Internal Medicine

## 2023-01-17 DIAGNOSIS — R7989 Other specified abnormal findings of blood chemistry: Secondary | ICD-10-CM

## 2023-01-17 DIAGNOSIS — E7800 Pure hypercholesterolemia, unspecified: Secondary | ICD-10-CM

## 2023-01-18 NOTE — Telephone Encounter (Signed)
Labs ordered and scheduled.  

## 2023-01-18 NOTE — Telephone Encounter (Signed)
Ok to order fasting labs with TSH and Free T3 and Free T4 (dx low tsh) along with other labs.

## 2023-02-17 ENCOUNTER — Ambulatory Visit
Admission: RE | Admit: 2023-02-17 | Discharge: 2023-02-17 | Disposition: A | Discharge status: Home / Self Care | Payer: BC Managed Care – PPO | Source: Ambulatory Visit | Attending: Internal Medicine | Admitting: Internal Medicine

## 2023-02-17 ENCOUNTER — Other Ambulatory Visit (INDEPENDENT_AMBULATORY_CARE_PROVIDER_SITE_OTHER): Payer: BC Managed Care – PPO

## 2023-02-17 DIAGNOSIS — Z1231 Encounter for screening mammogram for malignant neoplasm of breast: Secondary | ICD-10-CM

## 2023-02-17 DIAGNOSIS — R7989 Other specified abnormal findings of blood chemistry: Secondary | ICD-10-CM | POA: Diagnosis not present

## 2023-02-17 DIAGNOSIS — E7800 Pure hypercholesterolemia, unspecified: Secondary | ICD-10-CM

## 2023-02-17 LAB — T3, FREE: T3, Free: 2.9 pg/mL (ref 2.3–4.2)

## 2023-02-17 LAB — LIPID PANEL
Cholesterol: 225 mg/dL — ABNORMAL HIGH (ref 0–200)
HDL: 63.6 mg/dL (ref >39.00)
LDL Cholesterol: 143 mg/dL — ABNORMAL HIGH (ref 0–99)
NonHDL: 161.44
Total CHOL/HDL Ratio: 4
Triglycerides: 92 mg/dL (ref 0.0–149.0)
VLDL: 18.4 mg/dL (ref 0.0–40.0)

## 2023-02-17 LAB — BASIC METABOLIC PANEL
BUN: 17 mg/dL (ref 6–23)
CO2: 28 mEq/L (ref 19–32)
Calcium: 9.5 mg/dL (ref 8.4–10.5)
Chloride: 103 mEq/L (ref 96–112)
Creatinine, Ser: 0.77 mg/dL (ref 0.40–1.20)
GFR: 90.54 mL/min (ref >60.00)
Glucose, Bld: 94 mg/dL (ref 70–99)
Potassium: 4.2 mEq/L (ref 3.5–5.1)
Sodium: 140 mEq/L (ref 135–145)

## 2023-02-17 LAB — T4, FREE: Free T4: 0.64 ng/dL (ref 0.60–1.60)

## 2023-02-17 LAB — HEPATIC FUNCTION PANEL
ALT: 12 U/L (ref 0–35)
AST: 17 U/L (ref 0–37)
Albumin: 4.3 g/dL (ref 3.5–5.2)
Alkaline Phosphatase: 93 U/L (ref 39–117)
Bilirubin, Direct: 0.1 mg/dL (ref 0.0–0.3)
Total Bilirubin: 0.4 mg/dL (ref 0.2–1.2)
Total Protein: 7 g/dL (ref 6.0–8.3)

## 2023-02-17 LAB — TSH: TSH: 0.44 u[IU]/mL (ref 0.35–5.50)

## 2023-03-06 ENCOUNTER — Encounter: Payer: Self-pay | Admitting: Internal Medicine

## 2023-03-17 ENCOUNTER — Ambulatory Visit (INDEPENDENT_AMBULATORY_CARE_PROVIDER_SITE_OTHER): Payer: BC Managed Care – PPO

## 2023-03-17 ENCOUNTER — Encounter: Payer: Self-pay | Admitting: Nurse Practitioner

## 2023-03-17 ENCOUNTER — Ambulatory Visit: Payer: BC Managed Care – PPO | Admitting: Nurse Practitioner

## 2023-03-17 VITALS — BP 114/64 | HR 76 | Temp 97.7°F | Ht 60.0 in | Wt 165.4 lb

## 2023-03-17 DIAGNOSIS — R058 Other specified cough: Secondary | ICD-10-CM | POA: Diagnosis not present

## 2023-03-17 MED ORDER — BENZONATATE 200 MG PO CAPS
200.0000 mg | ORAL_CAPSULE | Freq: Two times a day (BID) | ORAL | 0 refills | Status: DC | PRN
Start: 2023-03-17 — End: 2023-04-13

## 2023-03-17 MED ORDER — AMOXICILLIN-POT CLAVULANATE 875-125 MG PO TABS
1.0000 | ORAL_TABLET | Freq: Two times a day (BID) | ORAL | 0 refills | Status: DC
Start: 2023-03-17 — End: 2023-04-13
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
## 2023-03-17 NOTE — Patient Instructions (Signed)
Rx sent to pharmacy. X ray ordered.  Increase fluid intake. Use Flonase nasal spray and Steam/ humidifier.

## 2023-03-26 DIAGNOSIS — R059 Cough, unspecified: Secondary | ICD-10-CM | POA: Insufficient documentation

## 2023-03-26 NOTE — Assessment & Plan Note (Addendum)
Chest x-ray ordered. Will treat with Augmentin and Tessalon Perles. Advised to use Flonase nasal spray Advised patient to increase fluid intake, use steam or humidifier.

## 2023-04-05 ENCOUNTER — Encounter: Payer: BC Managed Care – PPO | Admitting: Internal Medicine

## 2023-04-13 ENCOUNTER — Encounter: Payer: Self-pay | Admitting: Internal Medicine

## 2023-04-13 ENCOUNTER — Ambulatory Visit (INDEPENDENT_AMBULATORY_CARE_PROVIDER_SITE_OTHER): Payer: BC Managed Care – PPO | Admitting: Internal Medicine

## 2023-04-13 VITALS — BP 110/68 | HR 72 | Temp 97.9°F | Resp 16 | Ht 60.0 in | Wt 165.0 lb

## 2023-04-13 DIAGNOSIS — F411 Generalized anxiety disorder: Secondary | ICD-10-CM

## 2023-04-13 DIAGNOSIS — R059 Cough, unspecified: Secondary | ICD-10-CM | POA: Diagnosis not present

## 2023-04-13 DIAGNOSIS — Z0001 Encounter for general adult medical examination with abnormal findings: Secondary | ICD-10-CM

## 2023-04-13 DIAGNOSIS — Z0000 Encounter for general adult medical examination without abnormal findings: Secondary | ICD-10-CM

## 2023-04-13 DIAGNOSIS — G479 Sleep disorder, unspecified: Secondary | ICD-10-CM | POA: Diagnosis not present

## 2023-04-13 DIAGNOSIS — E7800 Pure hypercholesterolemia, unspecified: Secondary | ICD-10-CM

## 2023-04-13 DIAGNOSIS — K219 Gastro-esophageal reflux disease without esophagitis: Secondary | ICD-10-CM

## 2023-04-13 MED ORDER — CELECOXIB 100 MG PO CAPS: ORAL_CAPSULE | ORAL | 0 refills | Status: DC

## 2023-04-13 MED ORDER — PANTOPRAZOLE SODIUM 40 MG PO TBEC: DELAYED_RELEASE_TABLET | ORAL | 2 refills | Status: DC

## 2023-04-13 MED ORDER — SERTRALINE HCL 100 MG PO TABS: 100.0000 mg | ORAL_TABLET | Freq: Every day | ORAL | 1 refills | Status: DC

## 2023-04-13 NOTE — Assessment & Plan Note (Signed)
Physical today 04/13/23.  Pelvic/pap smears through gyn.  Breast exam through there as well.  Need records. Mammogram 02/17/23 - Birads I.  Colonoscopy 12/07/20 - normal.  Recommended f/u in 5 years.
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
## 2023-04-15 ENCOUNTER — Encounter: Payer: Self-pay | Admitting: Internal Medicine

## 2023-04-15 NOTE — Assessment & Plan Note (Signed)
Continue protonix  

## 2023-04-15 NOTE — Assessment & Plan Note (Signed)
Increased stress.  On zoloft.  Taking trazodone prn.  Does not feel needs any further intervention.  Follow.

## 2023-04-15 NOTE — Assessment & Plan Note (Signed)
Treated with augmentin and tessalon perles.  CXR - lungs clear.  Better.  Some cough. Discussed delsym.  Follow.  Notify if persistent.

## 2023-04-15 NOTE — Assessment & Plan Note (Signed)
Low cholesterol diet and exercise.  Follow lipid panel.   

## 2023-04-15 NOTE — Assessment & Plan Note (Signed)
Taking trazodone prn.  Follow.

## 2023-06-08 ENCOUNTER — Encounter (INDEPENDENT_AMBULATORY_CARE_PROVIDER_SITE_OTHER): Payer: Self-pay

## 2023-07-14 ENCOUNTER — Encounter: Payer: Self-pay | Admitting: Internal Medicine

## 2023-07-14 ENCOUNTER — Ambulatory Visit: Payer: BC Managed Care – PPO | Admitting: Internal Medicine

## 2023-07-14 VITALS — BP 110/68 | HR 72 | Temp 98.0°F | Ht 60.0 in | Wt 167.2 lb

## 2023-07-14 DIAGNOSIS — M79671 Pain in right foot: Secondary | ICD-10-CM

## 2023-07-14 DIAGNOSIS — G479 Sleep disorder, unspecified: Secondary | ICD-10-CM

## 2023-07-14 DIAGNOSIS — E7800 Pure hypercholesterolemia, unspecified: Secondary | ICD-10-CM | POA: Diagnosis not present

## 2023-07-14 DIAGNOSIS — K219 Gastro-esophageal reflux disease without esophagitis: Secondary | ICD-10-CM

## 2023-07-14 DIAGNOSIS — Z124 Encounter for screening for malignant neoplasm of cervix: Secondary | ICD-10-CM

## 2023-07-14 DIAGNOSIS — M25519 Pain in unspecified shoulder: Secondary | ICD-10-CM

## 2023-07-14 DIAGNOSIS — F411 Generalized anxiety disorder: Secondary | ICD-10-CM | POA: Diagnosis not present

## 2023-07-14 LAB — LIPID PANEL
Cholesterol: 221 mg/dL — ABNORMAL HIGH (ref 0–200)
HDL: 64.8 mg/dL (ref >39.00)
LDL Cholesterol: 135 mg/dL — ABNORMAL HIGH (ref 0–99)
NonHDL: 156.24
Total CHOL/HDL Ratio: 3
Triglycerides: 107 mg/dL (ref 0.0–149.0)
VLDL: 21.4 mg/dL (ref 0.0–40.0)

## 2023-07-14 LAB — HEPATIC FUNCTION PANEL
ALT: 16 U/L (ref 0–35)
AST: 21 U/L (ref 0–37)
Albumin: 4.2 g/dL (ref 3.5–5.2)
Alkaline Phosphatase: 98 U/L (ref 39–117)
Bilirubin, Direct: 0.1 mg/dL (ref 0.0–0.3)
Total Bilirubin: 0.3 mg/dL (ref 0.2–1.2)
Total Protein: 6.8 g/dL (ref 6.0–8.3)

## 2023-07-14 LAB — BASIC METABOLIC PANEL
BUN: 14 mg/dL (ref 6–23)
CO2: 28 mEq/L (ref 19–32)
Calcium: 9.2 mg/dL (ref 8.4–10.5)
Chloride: 103 mEq/L (ref 96–112)
Creatinine, Ser: 0.73 mg/dL (ref 0.40–1.20)
GFR: 96.25 mL/min (ref >60.00)
Glucose, Bld: 98 mg/dL (ref 70–99)
Potassium: 4.4 mEq/L (ref 3.5–5.1)
Sodium: 140 mEq/L (ref 135–145)

## 2023-07-14 LAB — TSH: TSH: 0.64 u[IU]/mL (ref 0.35–5.50)

## 2023-07-14 MED ORDER — SERTRALINE HCL 100 MG PO TABS: 100.0000 mg | ORAL_TABLET | Freq: Every day | ORAL | 1 refills | Status: DC

## 2023-07-14 MED ORDER — TRAZODONE HCL 50 MG PO TABS: ORAL_TABLET | ORAL | 2 refills | Status: DC

## 2023-07-14 MED ORDER — CELECOXIB 100 MG PO CAPS: ORAL_CAPSULE | ORAL | 0 refills | Status: DC

## 2023-07-14 MED ORDER — PANTOPRAZOLE SODIUM 40 MG PO TBEC: DELAYED_RELEASE_TABLET | ORAL | 2 refills | Status: DC
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
## 2023-07-16 ENCOUNTER — Encounter: Payer: Self-pay | Admitting: Internal Medicine

## 2023-07-16 DIAGNOSIS — M25519 Pain in unspecified shoulder: Secondary | ICD-10-CM | POA: Insufficient documentation

## 2023-07-16 DIAGNOSIS — M79671 Pain in right foot: Secondary | ICD-10-CM | POA: Insufficient documentation

## 2023-07-16 NOTE — Assessment & Plan Note (Signed)
S/p hysterectomy.  01/2022 - pap negative with negative HPV.

## 2023-07-16 NOTE — Assessment & Plan Note (Signed)
Continue protonix  

## 2023-07-16 NOTE — Assessment & Plan Note (Signed)
Low cholesterol diet and exercise.  Follow lipid panel.   

## 2023-07-16 NOTE — Assessment & Plan Note (Signed)
Increased stress.  On zoloft.  Taking trazodone prn.  Does not feel needs any further intervention.  Follow.

## 2023-07-16 NOTE — Assessment & Plan Note (Signed)
Neck and shoulder discomfort.  Relates to increased stress.  Discussed posture and stretches.  No limited rom.  Follow.

## 2023-07-16 NOTE — Assessment & Plan Note (Signed)
Discussed.  Takes trazodone prn. Discussed taking on a schedule if needed.  Follow.

## 2023-07-16 NOTE — Assessment & Plan Note (Signed)
Discussed possible etiologies including plantar fasciitis, arthritis.  Discussed stretches and supports.  Follow. Call with update.  If persistent, will have podiatry evaluate.

## 2023-07-18 ENCOUNTER — Telehealth: Payer: Self-pay

## 2023-07-24 NOTE — Telephone Encounter (Signed)
error 

## 2023-09-05 ENCOUNTER — Encounter: Payer: Self-pay | Admitting: Internal Medicine

## 2023-09-05 DIAGNOSIS — R1310 Dysphagia, unspecified: Secondary | ICD-10-CM

## 2023-09-05 NOTE — Telephone Encounter (Signed)
Epigastric pain, feels like food and her meds are getting stuck, increased reflux. Was given a GI cocktail at ED and she has been taking OTC pepcid.

## 2023-09-05 NOTE — Telephone Encounter (Signed)
I do not mind placing an order for a referral to Dr Servando Snare.  What symptoms is she having now?  I am also ok to schedule an appt to see her and can also do referral.

## 2023-09-05 NOTE — Telephone Encounter (Signed)
Actually, if she saw Dr Servando Snare in 2022, she should not need a new referral.  Please call and see if we can get an appt with Dr Servando Snare.  Pt with increasing problems with dysphagia.

## 2023-09-05 NOTE — Telephone Encounter (Signed)
It looks like she has not been seen by Dr Servando Snare since 2022. Do you want to see her first and then refer to Dr Servando Snare or go ahead and refer her to him?

## 2023-09-05 NOTE — Telephone Encounter (Signed)
FYI- I scheduled her for a virtual for her to talk to you on Friday 11/15 then will place referral to Dr Servando Snare

## 2023-09-06 NOTE — Telephone Encounter (Signed)
LM for [facility]

## 2023-09-08 ENCOUNTER — Encounter: Payer: Self-pay | Admitting: Internal Medicine

## 2023-09-08 ENCOUNTER — Telehealth: Payer: BC Managed Care – PPO | Admitting: Internal Medicine

## 2023-09-08 VITALS — Ht 60.0 in | Wt 165.0 lb

## 2023-09-08 DIAGNOSIS — R1013 Epigastric pain: Secondary | ICD-10-CM

## 2023-09-08 DIAGNOSIS — K219 Gastro-esophageal reflux disease without esophagitis: Secondary | ICD-10-CM

## 2023-09-08 MED ORDER — PANTOPRAZOLE SODIUM 40 MG PO TBEC: 40.0000 mg | DELAYED_RELEASE_TABLET | Freq: Two times a day (BID) | ORAL | 1 refills | Status: DC
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
## 2023-09-09 ENCOUNTER — Encounter: Payer: Self-pay | Admitting: Internal Medicine

## 2023-09-09 ENCOUNTER — Telehealth: Payer: Self-pay | Admitting: Internal Medicine

## 2023-09-09 DIAGNOSIS — R1013 Epigastric pain: Secondary | ICD-10-CM

## 2023-09-09 DIAGNOSIS — R109 Unspecified abdominal pain: Secondary | ICD-10-CM

## 2023-09-09 NOTE — Assessment & Plan Note (Signed)
Epigastric pain.  Increased acid reflux.  Dysphagia.  Treat with bid protonix as directed.  Pepcid before bed. Hold on further scanning.  Recent labs in ER wnl. Plan f/u with GI as outlined.

## 2023-09-09 NOTE — Assessment & Plan Note (Signed)
Has noticed increased acid reflux and recent epigastric pain and chest pain as outlined.  Some dysphagia.  On protonix. Will increase protonix to bid.  Pepcid before bed. Avoid foods that aggravate.  Avoid eating late. F/u with Dr Servando Snare - given persistent symptoms despite medication.

## 2023-09-09 NOTE — Telephone Encounter (Signed)
Has seen Dr Servando Snare.  Having persistent acid reflux despite medication.  Also with dysphagia. Needs f/u appt with Dr Servando Snare for question of need for EGD.  Please schedule.

## 2023-09-11 NOTE — Telephone Encounter (Signed)
Noted.  Please call and confirm if she is doing better.  Had started her on protonix bid and pepcid in the evening.

## 2023-09-11 NOTE — Telephone Encounter (Signed)
FYI pt is scheduled with Dr Servando Snare 12/17

## 2023-09-11 NOTE — Telephone Encounter (Signed)
If persistent pain - epigastric/abdominal pain - can schedule for CT abd/pelvis to further evaluate. If agreeable, confirm no allergy to contrast dye, iodine, shellfish.

## 2023-09-12 NOTE — Telephone Encounter (Signed)
Order placed for CT abd/pelvix.  Pt requested scan to be scheduled at [facility].  Thanks

## 2023-09-12 NOTE — Telephone Encounter (Signed)
Patient confirmed no allergy. She would like to have scan done at a [facility] facility.

## 2023-09-12 NOTE — Addendum Note (Signed)
Addended by: Charm Barges on: 09/12/2023 08:48 PM   Modules accepted: Orders

## 2023-09-19 NOTE — Telephone Encounter (Signed)
Patient just called to get a update on her CT Scan. She wanted to know if she can get it faxed to [facility]. The fax number is [phone]. Her number is [phone]. If you have any questions.

## 2023-09-19 NOTE — Telephone Encounter (Signed)
Pt following up on CT scan

## 2023-09-26 NOTE — Telephone Encounter (Signed)
Pt called checking on a referral for [facility]

## 2023-09-26 NOTE — Telephone Encounter (Signed)
See mychart message,

## 2023-09-26 NOTE — Telephone Encounter (Signed)
Patient states she spoke with [facility] and they do not have order.

## 2023-09-28 ENCOUNTER — Encounter: Payer: Self-pay | Admitting: Internal Medicine

## 2023-09-28 NOTE — Telephone Encounter (Signed)
Lorraine Morgan- can you please call her to discuss.

## 2023-09-28 NOTE — Telephone Encounter (Signed)
Patient called in to follow up on this. She stated that she attached a letter from her insurance in her mychart message.

## 2023-09-29 NOTE — Telephone Encounter (Signed)
Can we please get an update on this? There have been several message regarding this patient & her referral back and forth. Pt received a approval letter in the mail. See mychart message as well. Wants CT ordered at [facility].   Needs a response today on this. Thanks

## 2023-10-02 NOTE — Telephone Encounter (Signed)
Pt is aware.  

## 2023-10-02 NOTE — Telephone Encounter (Signed)
Order has been faxed to [facility] imaging.

## 2023-10-02 NOTE — Telephone Encounter (Signed)
Noted  

## 2023-10-02 NOTE — Telephone Encounter (Signed)
Order printed for signature.

## 2023-10-02 NOTE — Telephone Encounter (Signed)
Pt called stating that [facility] received a fax from [place] for the pt but it does not have the date, time and signature

## 2023-10-03 NOTE — Telephone Encounter (Signed)
Order refaxed

## 2023-10-04 ENCOUNTER — Telehealth: Payer: Self-pay

## 2023-10-04 NOTE — Telephone Encounter (Signed)
CT scheduled 12/21

## 2023-10-04 NOTE — Telephone Encounter (Signed)
Per pt can not get CT completed until 10-14-2023

## 2023-10-10 ENCOUNTER — Ambulatory Visit: Payer: BC Managed Care – PPO | Admitting: Gastroenterology

## 2023-10-10 VITALS — BP 117/74 | HR 69 | Temp 98.4°F | Ht 60.0 in | Wt 168.0 lb

## 2023-10-10 DIAGNOSIS — R1319 Other dysphagia: Secondary | ICD-10-CM

## 2023-10-10 DIAGNOSIS — R1310 Dysphagia, unspecified: Secondary | ICD-10-CM | POA: Diagnosis not present

## 2023-10-10 MED ORDER — ESOMEPRAZOLE MAGNESIUM 40 MG PO CPDR: 40.0000 mg | DELAYED_RELEASE_CAPSULE | Freq: Every day | ORAL | 1 refills | Status: DC
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
## 2023-10-11 ENCOUNTER — Encounter: Payer: Self-pay | Admitting: Gastroenterology
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
## 2023-10-16 ENCOUNTER — Ambulatory Visit: Payer: BC Managed Care – PPO | Admitting: Anesthesiology

## 2023-10-16 ENCOUNTER — Encounter: Payer: Self-pay | Admitting: Gastroenterology

## 2023-10-16 ENCOUNTER — Other Ambulatory Visit: Payer: Self-pay

## 2023-10-16 ENCOUNTER — Ambulatory Visit
Admission: RE | Admit: 2023-10-16 | Discharge: 2023-10-16 | Disposition: A | Discharge status: Home / Self Care | Payer: BC Managed Care – PPO | Attending: Gastroenterology | Admitting: Gastroenterology

## 2023-10-16 ENCOUNTER — Encounter
Admission: RE | Disposition: A | Discharge status: Home / Self Care | Payer: Self-pay | Source: Home / Self Care | Attending: Gastroenterology

## 2023-10-16 DIAGNOSIS — R1319 Other dysphagia: Secondary | ICD-10-CM

## 2023-10-16 DIAGNOSIS — R1310 Dysphagia, unspecified: Secondary | ICD-10-CM | POA: Diagnosis present

## 2023-10-16 DIAGNOSIS — K2289 Other specified disease of esophagus: Secondary | ICD-10-CM

## 2023-10-16 DIAGNOSIS — K219 Gastro-esophageal reflux disease without esophagitis: Secondary | ICD-10-CM | POA: Insufficient documentation

## 2023-10-16 HISTORY — PX: ESOPHAGEAL DILATION: SHX303

## 2023-10-16 HISTORY — PX: ESOPHAGOGASTRODUODENOSCOPY (EGD) WITH PROPOFOL: SHX5813

## 2023-10-16 SURGERY — ESOPHAGOGASTRODUODENOSCOPY (EGD) WITH PROPOFOL
Anesthesia: General

## 2023-10-16 MED ORDER — GLYCOPYRROLATE 0.2 MG/ML IJ SOLN
INTRAMUSCULAR | Status: AC
  Filled 2023-10-16: qty 1

## 2023-10-16 MED ORDER — SODIUM CHLORIDE 0.9 % IV SOLN: INTRAVENOUS | Status: DC

## 2023-10-16 MED ORDER — LIDOCAINE HCL (PF) 2 % IJ SOLN
INTRAMUSCULAR | Status: AC
  Filled 2023-10-16: qty 5

## 2023-10-16 MED ORDER — LIDOCAINE HCL (CARDIAC) PF 100 MG/5ML IV SOSY
PREFILLED_SYRINGE | INTRAVENOUS | Status: DC | PRN
  Administered 2023-10-16: 100 mg via INTRAVENOUS

## 2023-10-16 MED ORDER — GLYCOPYRROLATE 0.2 MG/ML IJ SOLN
INTRAMUSCULAR | Status: DC | PRN
  Administered 2023-10-16: .2 mg via INTRAVENOUS

## 2023-10-16 MED ORDER — PROPOFOL 10 MG/ML IV BOLUS
INTRAVENOUS | Status: DC | PRN
  Administered 2023-10-16 (×2): 30 mg via INTRAVENOUS
  Administered 2023-10-16: 80 mg via INTRAVENOUS
  Administered 2023-10-16: 30 mg via INTRAVENOUS

## 2023-10-16 SURGICAL SUPPLY — 28 items
BALLN DILATOR 12-15 8 (BALLOONS)
BALLN DILATOR 15-18 8 (BALLOONS) ×2
BALLN DILATOR CRE 0-12 8 (BALLOONS)
BALLN DILATOR ESOPH 8 10 CRE (MISCELLANEOUS) IMPLANT
BALLOON DILATOR 12-15 8 (BALLOONS) IMPLANT
BALLOON DILATOR 15-18 8 (BALLOONS) IMPLANT
BALLOON DILATOR CRE 0-12 8 (BALLOONS) IMPLANT
BLOCK BITE 60FR ADLT L/F GRN (MISCELLANEOUS) ×2 IMPLANT
CLIP HMST 235XBRD CATH ROT (MISCELLANEOUS) IMPLANT
ELECT REM PT RETURN 9FT ADLT (ELECTROSURGICAL)
ELECTRODE REM PT RTRN 9FT ADLT (ELECTROSURGICAL) IMPLANT
FCP ESCP3.2XJMB 240X2.8X (MISCELLANEOUS)
FORCEPS BIOP RAD 4 LRG CAP 4 (CUTTING FORCEPS) IMPLANT
FORCEPS ESCP3.2XJMB 240X2.8X (MISCELLANEOUS) IMPLANT
GOWN CVR UNV OPN BCK APRN NK (MISCELLANEOUS) ×4 IMPLANT
INJECTOR VARIJECT VIN23 (MISCELLANEOUS) IMPLANT
KIT DEFENDO VALVE AND CONN (KITS) IMPLANT
KIT PRC NS LF DISP ENDO (KITS) ×2 IMPLANT
MANIFOLD NEPTUNE II (INSTRUMENTS) ×2 IMPLANT
MARKER SPOT ENDO TATTOO 5ML (MISCELLANEOUS) IMPLANT
RETRIEVER NET PLAT FOOD (MISCELLANEOUS) IMPLANT
SNARE SHORT THROW 13M SML OVAL (MISCELLANEOUS) IMPLANT
SNARE SHORT THROW 30M LRG OVAL (MISCELLANEOUS) IMPLANT
SYR INFLATION 60ML (SYRINGE) IMPLANT
TRAP ETRAP POLY (MISCELLANEOUS) IMPLANT
VARIJECT INJECTOR VIN23 (MISCELLANEOUS)
WATER STERILE IRR 250ML POUR (IV SOLUTION) ×2 IMPLANT
WIRE CRE 18-20MM 8CM F G (MISCELLANEOUS) IMPLANT

## 2023-10-16 NOTE — Transfer of Care (Signed)
Immediate Anesthesia Transfer of Care Note  Patient: Lorraine Morgan  Procedure(s) Performed: ESOPHAGOGASTRODUODENOSCOPY (EGD) WITH PROPOFOL ESOPHAGEAL DILATION 15-18  Patient Location: PACU  Anesthesia Type: General  Level of Consciousness: awake, alert  and patient cooperative  Airway and Oxygen Therapy: Patient Spontanous Breathing and Patient connected to supplemental oxygen  Post-op Assessment: Post-op Vital signs reviewed, Patient's Cardiovascular Status Stable, Respiratory Function Stable, Patent Airway and No signs of Nausea or vomiting  Post-op Vital Signs: Reviewed and stable  Complications: No notable events documented.
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
## 2023-10-16 NOTE — Op Note (Signed)
[facility] Gastroenterology Patient Name: Lorraine Morgan Procedure Date: 10/16/2023 8:37 AM MRN: [id number] Account #: [id number] Date of Birth: Mar 12, 1971 Admit Type: Outpatient Age: 52 Room: [facility] OR ROOM 01 Gender: Female Note Status: Finalized Instrument Name: [id number] Procedure:             Upper GI endoscopy Indications:           Dysphagia Providers:             [name] MD, MD Referring MD:          Dale , MD (Referring MD) Medicines:             Propofol per Anesthesia Complications:         No immediate complications. Procedure:             Pre-Anesthesia Assessment:                        - Prior to the procedure, a History and Physical was                         performed, and patient medications and allergies were                         reviewed. The patient's tolerance of previous                         anesthesia was also reviewed. The risks and benefits                         of the procedure and the sedation options and risks                         were discussed with the patient. All questions were                         answered, and informed consent was obtained. Prior                         Anticoagulants: The patient has taken no anticoagulant                         or antiplatelet agents. ASA Grade Assessment: II - A                         patient with mild systemic disease. After reviewing                         the risks and benefits, the patient was deemed in                         satisfactory condition to undergo the procedure.                        After obtaining informed consent, the endoscope was                         passed under direct vision. Throughout the procedure,  the patient's blood pressure, pulse, and oxygen                         saturations were monitored continuously. The Endoscope                         was introduced through the mouth, and advanced to the                          second part of duodenum. The upper GI endoscopy was                         accomplished without difficulty. The patient tolerated                         the procedure well. Findings:      The examined esophagus was normal. Several biopsies were obtained in the       middle third of the esophagus with cold forceps for histology. A TTS       dilator was passed through the scope. Dilation with a 15-16.5-18 mm       balloon dilator was performed to 18 mm. The dilation site was examined       following endoscope reinsertion and showed complete resolution of       luminal narrowing.      The stomach was normal.      The examined duodenum was normal. Impression:            - Normal esophagus. Dilated.                        - Normal stomach.                        - Normal examined duodenum.                        - Several biopsies were obtained in the middle third                         of the esophagus. Recommendation:        - Discharge patient to home.                        - Resume previous diet.                        - Continue present medications.                        - Await pathology results. Procedure Code(s):     --- Professional ---                        [phone], Esophagogastroduodenoscopy, flexible,                         transoral; with transendoscopic balloon dilation of                         esophagus (less than 30 mm diameter)  43239, 59, Esophagogastroduodenoscopy, flexible,                         transoral; with biopsy, single or multiple Diagnosis Code(s):     --- Professional ---                        R13.10, Dysphagia, unspecified CPT copyright 2022 American Medical Association. All rights reserved. The codes documented in this report are preliminary and upon coder review may  be revised to meet current compliance requirements. [name] MD, MD 10/16/2023 8:54:18 AM This report has been signed electronically. Number of  Addenda: 0 Note Initiated On: 10/16/2023 8:37 AM Total Procedure Duration: 0 hours 6 minutes 22 seconds  Estimated Blood Loss:  Estimated blood loss: none.      [facility]

## 2023-10-16 NOTE — Anesthesia Postprocedure Evaluation (Signed)
Anesthesia Post Note  Patient: Lorraine Morgan  Procedure(s) Performed: ESOPHAGOGASTRODUODENOSCOPY (EGD) WITH PROPOFOL ESOPHAGEAL DILATION 15-18  Patient location during evaluation: PACU Anesthesia Type: General Level of consciousness: awake and alert Pain management: pain level controlled Vital Signs Assessment: post-procedure vital signs reviewed and stable Respiratory status: spontaneous breathing, nonlabored ventilation, respiratory function stable and patient connected to nasal cannula oxygen Cardiovascular status: blood pressure returned to baseline and stable Postop Assessment: no apparent nausea or vomiting Anesthetic complications: no   No notable events documented.   Last Vitals:  Vitals:   10/16/23 0901 10/16/23 0907  BP: 105/70 98/82  Pulse: 91 92  Resp: 18 13  Temp:    SpO2: 98% 99%    Last Pain:  Vitals:   10/16/23 0907  TempSrc:   PainSc: 0-No pain                 Marisue Humble

## 2023-10-17 ENCOUNTER — Encounter: Payer: Self-pay | Admitting: Gastroenterology

## 2023-10-17 LAB — SURGICAL PATHOLOGY

## 2023-10-31 ENCOUNTER — Telehealth: Payer: Self-pay | Admitting: Internal Medicine

## 2023-10-31 NOTE — Telephone Encounter (Signed)
 Patient need lab orders.

## 2023-11-01 NOTE — Telephone Encounter (Signed)
 Lab appt canceled. Will do fasting labs at appt with pcp on 1/20.

## 2023-11-13 ENCOUNTER — Other Ambulatory Visit: Payer: BC Managed Care – PPO

## 2023-11-13 ENCOUNTER — Ambulatory Visit (INDEPENDENT_AMBULATORY_CARE_PROVIDER_SITE_OTHER): Payer: 59 | Admitting: Internal Medicine

## 2023-11-13 ENCOUNTER — Encounter: Payer: Self-pay | Admitting: Internal Medicine

## 2023-11-13 VITALS — BP 108/68 | HR 83 | Temp 98.2°F | Resp 16 | Ht 60.0 in | Wt 167.0 lb

## 2023-11-13 DIAGNOSIS — R1319 Other dysphagia: Secondary | ICD-10-CM | POA: Diagnosis not present

## 2023-11-13 DIAGNOSIS — F411 Generalized anxiety disorder: Secondary | ICD-10-CM

## 2023-11-13 DIAGNOSIS — K760 Fatty (change of) liver, not elsewhere classified: Secondary | ICD-10-CM | POA: Diagnosis not present

## 2023-11-13 DIAGNOSIS — K219 Gastro-esophageal reflux disease without esophagitis: Secondary | ICD-10-CM | POA: Diagnosis not present

## 2023-11-13 DIAGNOSIS — R0981 Nasal congestion: Secondary | ICD-10-CM | POA: Insufficient documentation

## 2023-11-13 DIAGNOSIS — E7800 Pure hypercholesterolemia, unspecified: Secondary | ICD-10-CM

## 2023-11-13 DIAGNOSIS — R160 Hepatomegaly, not elsewhere classified: Secondary | ICD-10-CM | POA: Insufficient documentation

## 2023-11-13 LAB — HEPATIC FUNCTION PANEL
ALT: 16 U/L (ref 0–35)
AST: 19 U/L (ref 0–37)
Albumin: 4.3 g/dL (ref 3.5–5.2)
Alkaline Phosphatase: 102 U/L (ref 39–117)
Bilirubin, Direct: 0 mg/dL (ref 0.0–0.3)
Total Bilirubin: 0.4 mg/dL (ref 0.2–1.2)
Total Protein: 7.1 g/dL (ref 6.0–8.3)

## 2023-11-13 LAB — LIPID PANEL
Cholesterol: 210 mg/dL — ABNORMAL HIGH (ref 0–200)
HDL: 61.9 mg/dL (ref >39.00)
LDL Cholesterol: 128 mg/dL — ABNORMAL HIGH (ref 0–99)
NonHDL: 147.96
Total CHOL/HDL Ratio: 3
Triglycerides: 98 mg/dL (ref 0.0–149.0)
VLDL: 19.6 mg/dL (ref 0.0–40.0)

## 2023-11-13 LAB — BASIC METABOLIC PANEL
BUN: 16 mg/dL (ref 6–23)
CO2: 26 meq/L (ref 19–32)
Calcium: 9.1 mg/dL (ref 8.4–10.5)
Chloride: 105 meq/L (ref 96–112)
Creatinine, Ser: 0.73 mg/dL (ref 0.40–1.20)
GFR: 96.03 mL/min (ref >60.00)
Glucose, Bld: 106 mg/dL — ABNORMAL HIGH (ref 70–99)
Potassium: 4.1 meq/L (ref 3.5–5.1)
Sodium: 141 meq/L (ref 135–145)

## 2023-11-13 MED ORDER — TRAZODONE HCL 50 MG PO TABS: ORAL_TABLET | ORAL | 2 refills | Status: DC

## 2023-11-13 MED ORDER — SERTRALINE HCL 100 MG PO TABS: 100.0000 mg | ORAL_TABLET | Freq: Every day | ORAL | 1 refills | Status: DC

## 2023-11-13 MED ORDER — CELECOXIB 100 MG PO CAPS: ORAL_CAPSULE | ORAL | 0 refills | Status: DC
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
## 2023-11-13 NOTE — Assessment & Plan Note (Addendum)
Saw Dr Servando Snare 10/10/23 - f/u persistent acid reflux and dysphagia. Protonix changed to nexium 40mg  q day. EGD 10/16/23 - normal esophagus - dilated. Normal stomach and duodenum.swallowing is better.

## 2023-11-13 NOTE — Assessment & Plan Note (Signed)
Low cholesterol diet and exercise.  Follow lipid panel.   

## 2023-11-13 NOTE — Assessment & Plan Note (Signed)
Noted on CT scan. Discussed diet and exercise. Refer to GI as outlined.

## 2023-11-13 NOTE — Assessment & Plan Note (Signed)
Noted on recent CT scan. Discussed referral to GI for further evaluation - fatty liver/hepatomegaly.

## 2023-11-13 NOTE — Assessment & Plan Note (Signed)
Acid reflux as outlined. Persistent. Continue to avoid foods that aggravate. Avoid eating late. Increased nexium to bid. Recent EGD s/p dilatation. Given persistent symptoms despite nexium, get her in with GI for further evaluation.

## 2023-11-13 NOTE — Assessment & Plan Note (Signed)
Continue zoloft. Taking trazodone. Stable. Follow.

## 2023-11-13 NOTE — Assessment & Plan Note (Signed)
Steroid nasal spray (nasacort) and saline nasal spray as directed. Follow.  Call with update.

## 2023-11-13 NOTE — Patient Instructions (Signed)
Saline nasal spray - flush nose 1-2 x/day  Nasacort nasal spray - 2 sprays each nostril one time per day.  Do this in the evening.

## 2023-11-15 ENCOUNTER — Ambulatory Visit: Payer: BC Managed Care – PPO | Admitting: Internal Medicine

## 2023-12-15 ENCOUNTER — Other Ambulatory Visit: Payer: Self-pay | Admitting: Gastroenterology

## 2024-01-16 IMAGING — MG MM DIGITAL SCREENING BILAT W/ TOMO AND CAD
8 series · 8 of 24 positions shown · non-contrast
Comparison: Previous exam(s).

CLINICAL DATA: Screening.

EXAM:
DIGITAL SCREENING BILATERAL MAMMOGRAM WITH TOMOSYNTHESIS AND CAD
TECHNIQUE: Bilateral screening digital craniocaudal and mediolateral oblique
mammograms were obtained. Bilateral screening digital breast
tomosynthesis was performed. The images were evaluated with
computer-aided detection.

[L MLO synth-2D]
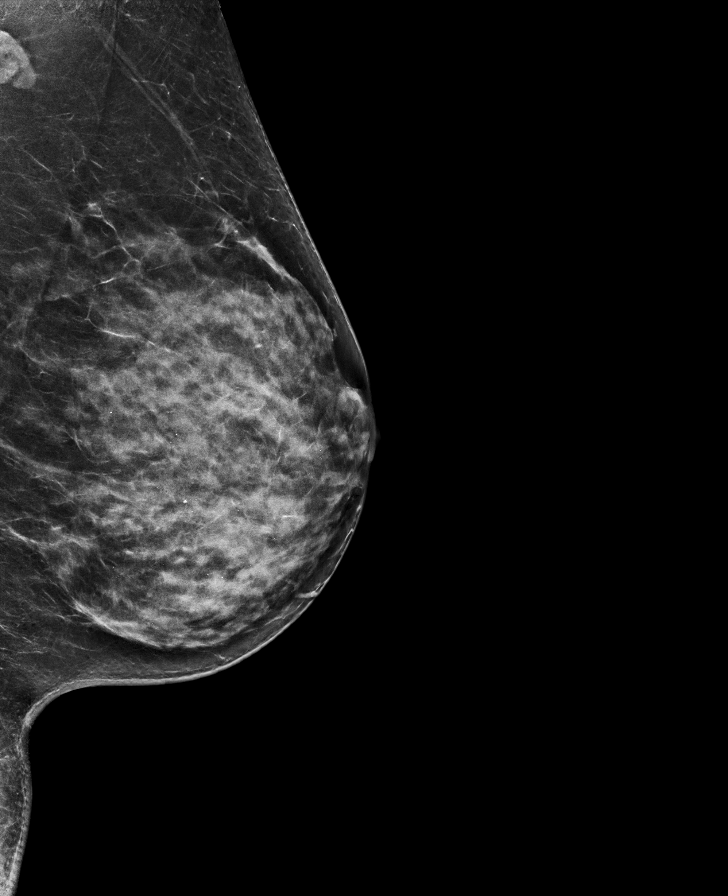

[L CC synth-2D]
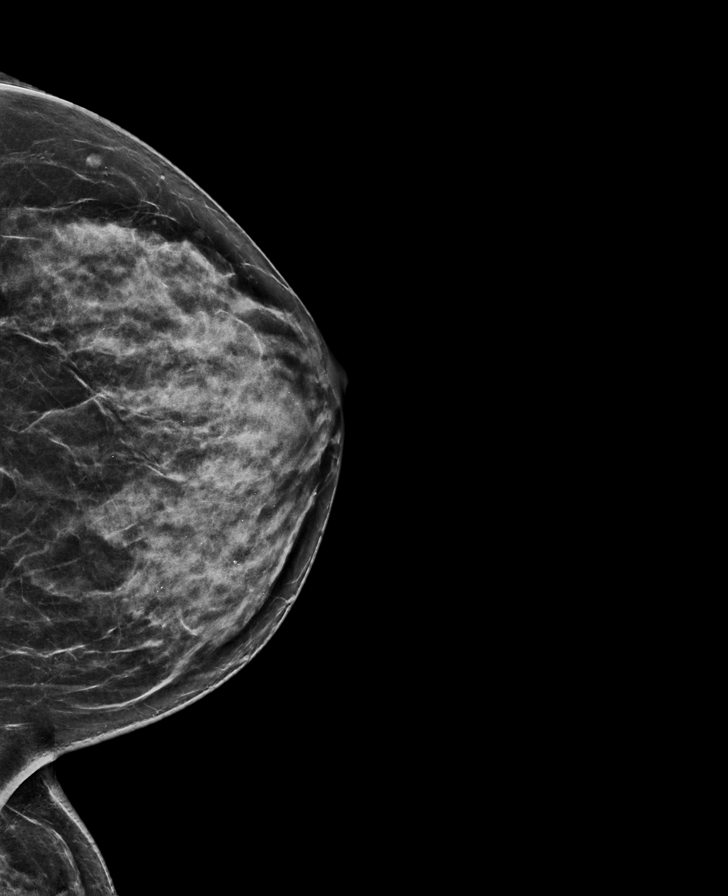

[R MLO synth-2D]
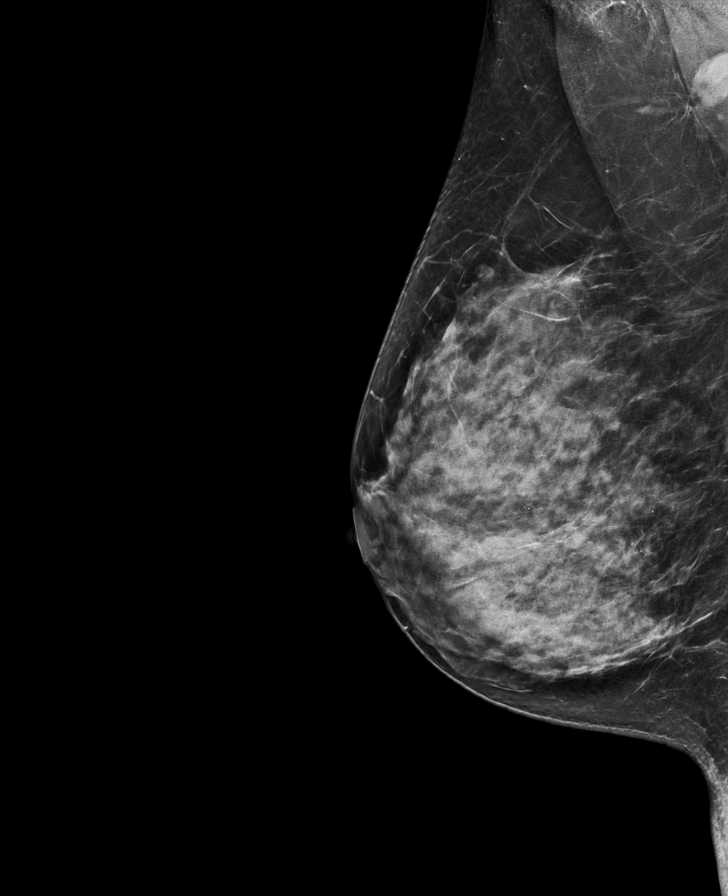

[R CC synth-2D]
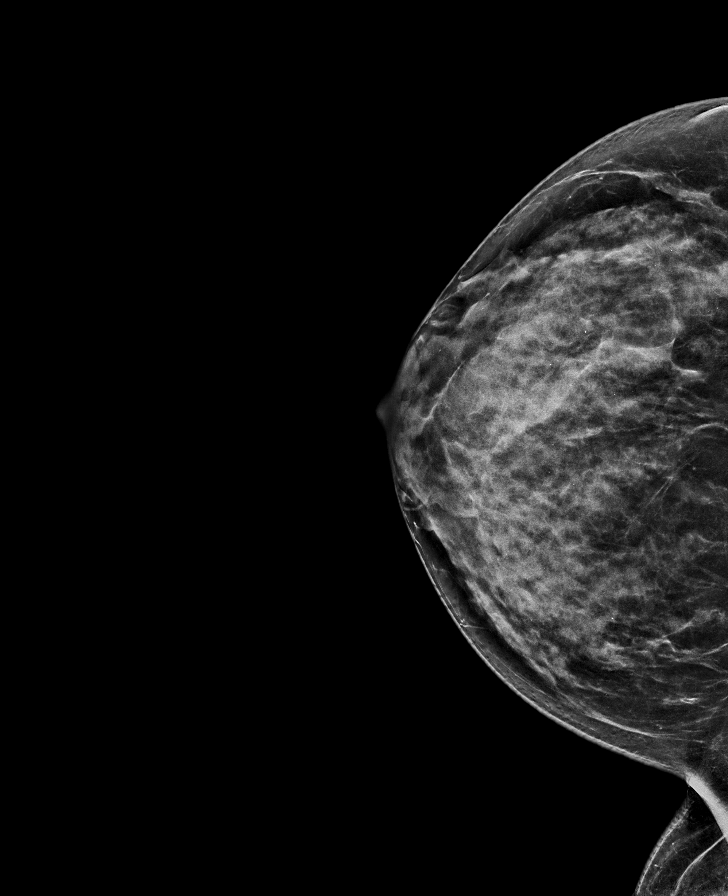

[R CC tomo · tomo slice 37/74.0]
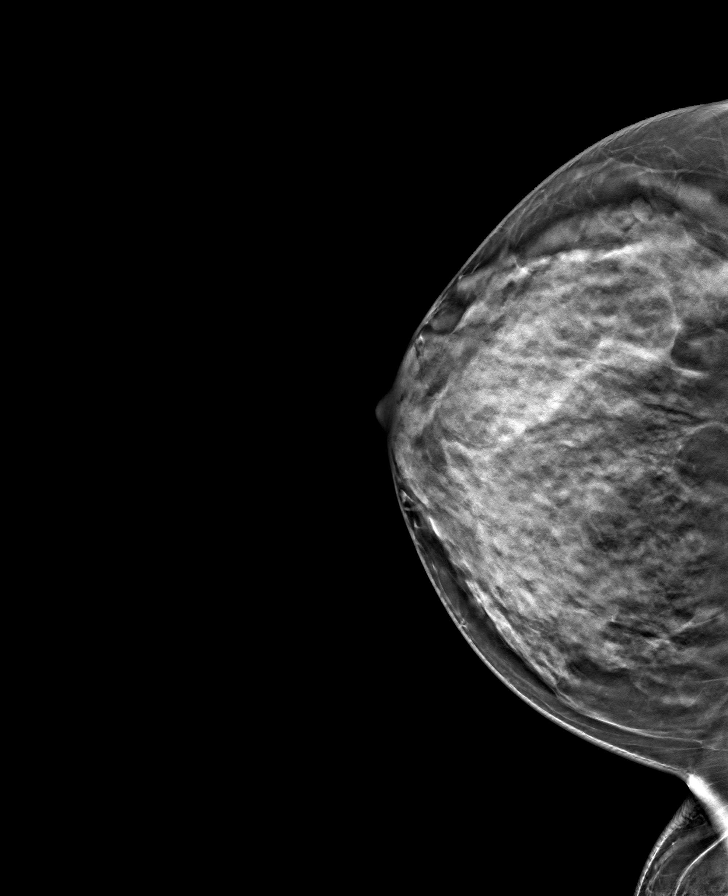

[L MLO tomo · tomo slice 39/77.0]
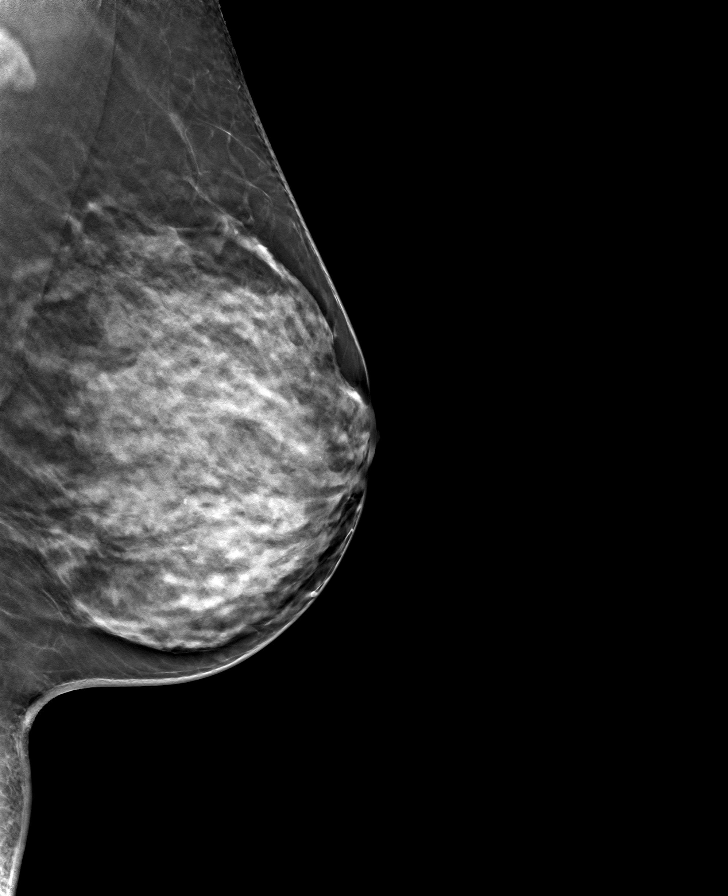

[L CC tomo · tomo slice 38/75.0]
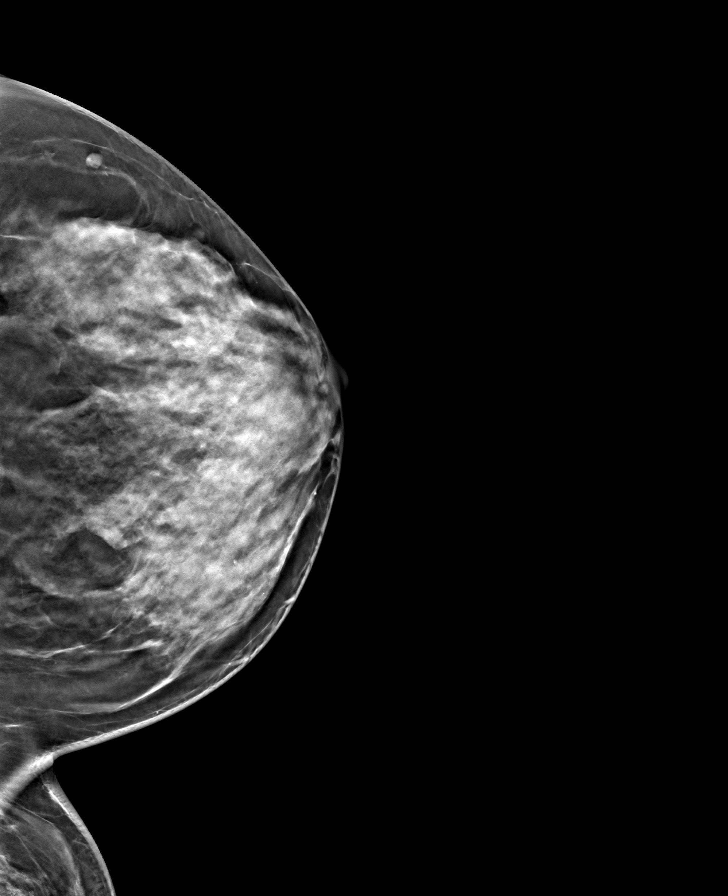

[R MLO tomo · tomo slice 43/84.0]
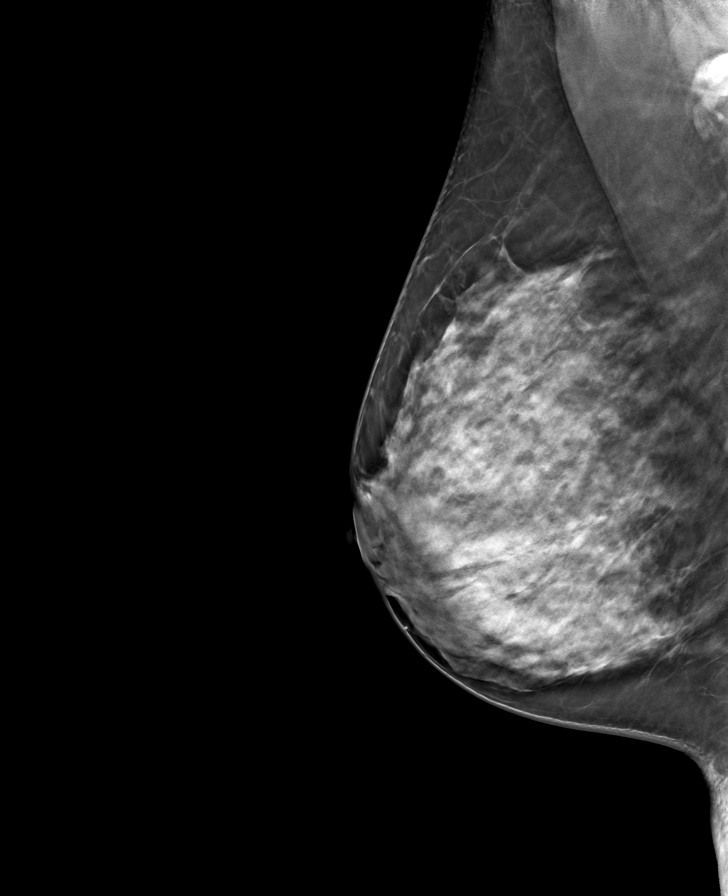

[8 of 24 positions shown; findings below may reference images not displayed]

ACR Breast Density Category d: The breast tissue is extremely dense,
which lowers the sensitivity of mammography
FINDINGS: There are no findings suspicious for malignancy.
IMPRESSION: No mammographic evidence of malignancy. A result letter of this
screening mammogram will be mailed directly to the patient.

RECOMMENDATION:
Screening mammogram in one year. (Code:TA-V-WV9)

BI-RADS CATEGORY  1: Negative.

## 2024-03-13 ENCOUNTER — Ambulatory Visit: Payer: Self-pay | Admitting: Internal Medicine

## 2024-03-13 ENCOUNTER — Ambulatory Visit (INDEPENDENT_AMBULATORY_CARE_PROVIDER_SITE_OTHER): Payer: 59 | Admitting: Internal Medicine

## 2024-03-13 ENCOUNTER — Encounter: Payer: Self-pay | Admitting: Internal Medicine

## 2024-03-13 VITALS — BP 118/72 | HR 87 | Temp 98.0°F | Resp 16 | Ht 60.0 in | Wt 167.0 lb

## 2024-03-13 DIAGNOSIS — R160 Hepatomegaly, not elsewhere classified: Secondary | ICD-10-CM

## 2024-03-13 DIAGNOSIS — E7800 Pure hypercholesterolemia, unspecified: Secondary | ICD-10-CM | POA: Diagnosis not present

## 2024-03-13 DIAGNOSIS — M2550 Pain in unspecified joint: Secondary | ICD-10-CM

## 2024-03-13 DIAGNOSIS — Z1231 Encounter for screening mammogram for malignant neoplasm of breast: Secondary | ICD-10-CM

## 2024-03-13 DIAGNOSIS — K760 Fatty (change of) liver, not elsewhere classified: Secondary | ICD-10-CM | POA: Diagnosis not present

## 2024-03-13 DIAGNOSIS — F411 Generalized anxiety disorder: Secondary | ICD-10-CM

## 2024-03-13 LAB — CBC WITH DIFFERENTIAL/PLATELET
Basophils Absolute: 0 10*3/uL (ref 0.0–0.1)
Basophils Relative: 0.6 % (ref 0.0–3.0)
Eosinophils Absolute: 0 10*3/uL (ref 0.0–0.7)
Eosinophils Relative: 0.8 % (ref 0.0–5.0)
HCT: 39.1 % (ref 36.0–46.0)
Hemoglobin: 12.8 g/dL (ref 12.0–15.0)
Lymphocytes Relative: 43.8 % (ref 12.0–46.0)
Lymphs Abs: 2.1 10*3/uL (ref 0.7–4.0)
MCHC: 32.6 g/dL (ref 30.0–36.0)
MCV: 88.6 fl (ref 78.0–100.0)
Monocytes Absolute: 0.4 10*3/uL (ref 0.1–1.0)
Monocytes Relative: 9.2 % (ref 3.0–12.0)
Neutro Abs: 2.1 10*3/uL (ref 1.4–7.7)
Neutrophils Relative %: 45.6 % (ref 43.0–77.0)
Platelets: 213 10*3/uL (ref 150.0–400.0)
RBC: 4.42 Mil/uL (ref 3.87–5.11)
RDW: 14.1 % (ref 11.5–15.5)
WBC: 4.7 10*3/uL (ref 4.0–10.5)

## 2024-03-13 LAB — BASIC METABOLIC PANEL WITH GFR
BUN: 17 mg/dL (ref 6–23)
CO2: 29 meq/L (ref 19–32)
Calcium: 9.2 mg/dL (ref 8.4–10.5)
Chloride: 103 meq/L (ref 96–112)
Creatinine, Ser: 0.85 mg/dL (ref 0.40–1.20)
GFR: 79.81 mL/min (ref >60.00)
Glucose, Bld: 109 mg/dL — ABNORMAL HIGH (ref 70–99)
Potassium: 4.1 meq/L (ref 3.5–5.1)
Sodium: 139 meq/L (ref 135–145)

## 2024-03-13 LAB — HEPATIC FUNCTION PANEL
ALT: 19 U/L (ref 0–35)
AST: 21 U/L (ref 0–37)
Albumin: 4.5 g/dL (ref 3.5–5.2)
Alkaline Phosphatase: 107 U/L (ref 39–117)
Bilirubin, Direct: 0.1 mg/dL (ref 0.0–0.3)
Total Bilirubin: 0.4 mg/dL (ref 0.2–1.2)
Total Protein: 7.3 g/dL (ref 6.0–8.3)

## 2024-03-13 LAB — TSH: TSH: 0.42 u[IU]/mL (ref 0.35–5.50)

## 2024-03-13 LAB — LIPID PANEL
Cholesterol: 242 mg/dL — ABNORMAL HIGH (ref 0–200)
HDL: 65 mg/dL (ref >39.00)
LDL Cholesterol: 158 mg/dL — ABNORMAL HIGH (ref 0–99)
NonHDL: 177.33
Total CHOL/HDL Ratio: 4
Triglycerides: 99 mg/dL (ref 0.0–149.0)
VLDL: 19.8 mg/dL (ref 0.0–40.0)

## 2024-03-13 MED ORDER — SERTRALINE HCL 100 MG PO TABS: ORAL_TABLET | ORAL | 1 refills | Status: DC
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
## 2024-03-13 NOTE — Assessment & Plan Note (Signed)
 Increased stress as outlined. Discussed.  On zoloft  100mg  q day. Discussed increasing dose. Will increase to 150mg  q day. Rarely takes trazodone . Follow. Continue seeing therapist. Call with update.

## 2024-03-13 NOTE — Assessment & Plan Note (Signed)
 Low cholesterol diet and exercise.  Follow lipid panel.

## 2024-03-13 NOTE — Assessment & Plan Note (Addendum)
 Had CT abdomen - 10/14/23 - hepatomegaly with hepatic hemangioma. Evaluated by GI - minimal hepatic steatosis and fibrosis noted on FibroScan. Recommended diet and exercise. Regarding hepatic hemangioma - benign - no further follow up warranted. Check liver panel today.

## 2024-03-13 NOTE — Assessment & Plan Note (Signed)
 Had CT abdomen - 10/14/23 - hepatomegaly with hepatic hemangioma. Evaluated by GI - minimal hepatic steatosis and fibrosis noted on FibroScan. Recommended diet and exercise. Regarding hepatic hemangioma - benign - no further follow up warranted.

## 2024-03-27 ENCOUNTER — Ambulatory Visit
Admission: RE | Admit: 2024-03-27 | Discharge: 2024-03-27 | Disposition: A | Discharge status: Home / Self Care | Source: Ambulatory Visit | Attending: Internal Medicine | Admitting: Internal Medicine

## 2024-03-27 DIAGNOSIS — Z1231 Encounter for screening mammogram for malignant neoplasm of breast: Secondary | ICD-10-CM | POA: Diagnosis present

## 2024-06-09 ENCOUNTER — Other Ambulatory Visit: Payer: Self-pay | Admitting: Internal Medicine

## 2024-06-19 ENCOUNTER — Encounter: Payer: Self-pay | Admitting: Internal Medicine

## 2024-06-19 ENCOUNTER — Ambulatory Visit (INDEPENDENT_AMBULATORY_CARE_PROVIDER_SITE_OTHER): Admitting: Internal Medicine

## 2024-06-19 VITALS — BP 112/68 | HR 86 | Resp 16 | Ht 60.0 in | Wt 165.0 lb

## 2024-06-19 DIAGNOSIS — Z0000 Encounter for general adult medical examination without abnormal findings: Secondary | ICD-10-CM | POA: Diagnosis not present

## 2024-06-19 DIAGNOSIS — F411 Generalized anxiety disorder: Secondary | ICD-10-CM

## 2024-06-19 DIAGNOSIS — K760 Fatty (change of) liver, not elsewhere classified: Secondary | ICD-10-CM

## 2024-06-19 DIAGNOSIS — R0989 Other specified symptoms and signs involving the circulatory and respiratory systems: Secondary | ICD-10-CM

## 2024-06-19 DIAGNOSIS — E7800 Pure hypercholesterolemia, unspecified: Secondary | ICD-10-CM | POA: Diagnosis not present

## 2024-06-19 DIAGNOSIS — K219 Gastro-esophageal reflux disease without esophagitis: Secondary | ICD-10-CM

## 2024-06-19 DIAGNOSIS — Z124 Encounter for screening for malignant neoplasm of cervix: Secondary | ICD-10-CM

## 2024-06-19 DIAGNOSIS — R160 Hepatomegaly, not elsewhere classified: Secondary | ICD-10-CM

## 2024-06-19 LAB — BASIC METABOLIC PANEL WITH GFR
BUN: 10 mg/dL (ref 6–23)
CO2: 28 meq/L (ref 19–32)
Calcium: 8.9 mg/dL (ref 8.4–10.5)
Chloride: 104 meq/L (ref 96–112)
Creatinine, Ser: 0.75 mg/dL (ref 0.40–1.20)
GFR: 92.57 mL/min (ref >60.00)
Glucose, Bld: 101 mg/dL — ABNORMAL HIGH (ref 70–99)
Potassium: 4.3 meq/L (ref 3.5–5.1)
Sodium: 140 meq/L (ref 135–145)

## 2024-06-19 LAB — LIPID PANEL
Cholesterol: 220 mg/dL — ABNORMAL HIGH (ref 0–200)
HDL: 62.5 mg/dL (ref >39.00)
LDL Cholesterol: 135 mg/dL — ABNORMAL HIGH (ref 0–99)
NonHDL: 157.65
Total CHOL/HDL Ratio: 4
Triglycerides: 114 mg/dL (ref 0.0–149.0)
VLDL: 22.8 mg/dL (ref 0.0–40.0)

## 2024-06-19 LAB — HEPATIC FUNCTION PANEL
ALT: 21 U/L (ref 0–35)
AST: 24 U/L (ref 0–37)
Albumin: 4.2 g/dL (ref 3.5–5.2)
Alkaline Phosphatase: 96 U/L (ref 39–117)
Bilirubin, Direct: 0.1 mg/dL (ref 0.0–0.3)
Total Bilirubin: 0.4 mg/dL (ref 0.2–1.2)
Total Protein: 7 g/dL (ref 6.0–8.3)

## 2024-06-19 MED ORDER — SERTRALINE HCL 100 MG PO TABS: ORAL_TABLET | ORAL | 1 refills | Status: DC

## 2024-06-19 MED ORDER — TRAZODONE HCL 50 MG PO TABS: 75.0000 mg | ORAL_TABLET | Freq: Every day | ORAL | 1 refills | Status: DC

## 2024-06-19 NOTE — Assessment & Plan Note (Addendum)
 Physical today 06/19/24.  Pap 01/2022- negative HPV.  Mammogram 04/16/24 - Birads I.  Colonoscopy 12/07/20 - normal.  Recommended f/u in 5 years.
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
## 2024-06-21 ENCOUNTER — Ambulatory Visit: Payer: Self-pay | Admitting: Internal Medicine

## 2024-06-25 ENCOUNTER — Encounter: Payer: Self-pay | Admitting: Internal Medicine

## 2024-06-25 NOTE — Assessment & Plan Note (Signed)
 Low cholesterol diet and exercise.  Follow lipid panel.

## 2024-06-25 NOTE — Assessment & Plan Note (Signed)
 Continues nexium . No increased acid reflux reported.

## 2024-06-25 NOTE — Assessment & Plan Note (Signed)
 Describes throat fullness as outlined. Occurs when sleeping. Discussed further w/up and evaluation. Refer to ENT. Acid reflux appears to be controlled. Continue PPI.

## 2024-06-25 NOTE — Assessment & Plan Note (Signed)
 Had CT abdomen - 10/14/23 - hepatomegaly with hepatic hemangioma. Evaluated by GI - minimal hepatic steatosis and fibrosis noted on FibroScan. Recommended diet and exercise. Regarding hepatic hemangioma - benign - no further follow up warranted. Follow liver function tests.

## 2024-06-25 NOTE — Assessment & Plan Note (Signed)
S/p hysterectomy.  01/2022 - pap negative with negative HPV.

## 2024-06-25 NOTE — Assessment & Plan Note (Signed)
 Continue zoloft . Follow. Notify me if feels needs further intervention.

## 2024-06-25 NOTE — Assessment & Plan Note (Signed)
 Had CT abdomen - 10/14/23 - hepatomegaly with hepatic hemangioma. Evaluated by GI - minimal hepatic steatosis and fibrosis noted on FibroScan. Recommended diet and exercise. Regarding hepatic hemangioma - benign - no further follow up warranted.

## 2024-09-14 ENCOUNTER — Other Ambulatory Visit: Payer: Self-pay | Admitting: Internal Medicine

## 2024-10-30 ENCOUNTER — Ambulatory Visit: Admitting: Internal Medicine

## 2024-10-30 ENCOUNTER — Encounter: Payer: Self-pay | Admitting: Internal Medicine

## 2024-10-30 VITALS — BP 120/74 | HR 83 | Temp 97.9°F | Ht 60.0 in | Wt 166.4 lb

## 2024-10-30 DIAGNOSIS — K219 Gastro-esophageal reflux disease without esophagitis: Secondary | ICD-10-CM | POA: Diagnosis not present

## 2024-10-30 DIAGNOSIS — R160 Hepatomegaly, not elsewhere classified: Secondary | ICD-10-CM | POA: Diagnosis not present

## 2024-10-30 DIAGNOSIS — F411 Generalized anxiety disorder: Secondary | ICD-10-CM

## 2024-10-30 DIAGNOSIS — R1319 Other dysphagia: Secondary | ICD-10-CM

## 2024-10-30 DIAGNOSIS — E7800 Pure hypercholesterolemia, unspecified: Secondary | ICD-10-CM

## 2024-10-30 DIAGNOSIS — M2550 Pain in unspecified joint: Secondary | ICD-10-CM

## 2024-10-30 DIAGNOSIS — K760 Fatty (change of) liver, not elsewhere classified: Secondary | ICD-10-CM

## 2024-10-30 LAB — HEPATIC FUNCTION PANEL
ALT: 22 U/L (ref 3–35)
AST: 22 U/L (ref 5–37)
Albumin: 4.2 g/dL (ref 3.5–5.2)
Alkaline Phosphatase: 92 U/L (ref 39–117)
Bilirubin, Direct: 0.1 mg/dL (ref 0.1–0.3)
Total Bilirubin: 0.3 mg/dL (ref 0.2–1.2)
Total Protein: 6.6 g/dL (ref 6.0–8.3)

## 2024-10-30 LAB — BASIC METABOLIC PANEL WITH GFR
BUN: 15 mg/dL (ref 6–23)
CO2: 30 meq/L (ref 19–32)
Calcium: 8.8 mg/dL (ref 8.4–10.5)
Chloride: 105 meq/L (ref 96–112)
Creatinine, Ser: 0.73 mg/dL (ref 0.40–1.20)
GFR: 95.38 mL/min
Glucose, Bld: 92 mg/dL (ref 70–99)
Potassium: 3.9 meq/L (ref 3.5–5.1)
Sodium: 141 meq/L (ref 135–145)

## 2024-10-30 LAB — LIPID PANEL
Cholesterol: 234 mg/dL — ABNORMAL HIGH (ref 28–200)
HDL: 59.2 mg/dL
LDL Cholesterol: 154 mg/dL — ABNORMAL HIGH (ref 10–99)
NonHDL: 174.67
Total CHOL/HDL Ratio: 4
Triglycerides: 103 mg/dL (ref 10.0–149.0)
VLDL: 20.6 mg/dL (ref 0.0–40.0)

## 2024-10-30 MED ORDER — TRAZODONE HCL 50 MG PO TABS: 75.0000 mg | ORAL_TABLET | Freq: Every day | ORAL | 1 refills | Status: AC

## 2024-10-30 MED ORDER — ESOMEPRAZOLE MAGNESIUM 40 MG PO CPDR: 40.0000 mg | DELAYED_RELEASE_CAPSULE | Freq: Every day | ORAL | 1 refills | Status: AC

## 2024-10-30 MED ORDER — SERTRALINE HCL 100 MG PO TABS: ORAL_TABLET | ORAL | 1 refills | Status: AC
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
## 2024-10-30 NOTE — Patient Instructions (Signed)
 Debrox - 3-4 drops in each ear daily as needed.  Massage for approximately 5 minutes.

## 2024-10-31 ENCOUNTER — Ambulatory Visit: Payer: Self-pay | Admitting: Internal Medicine

## 2024-11-03 ENCOUNTER — Encounter: Payer: Self-pay | Admitting: Internal Medicine

## 2024-11-03 NOTE — Assessment & Plan Note (Signed)
 Saw Dr Jinny 10/10/23 - f/u dysphagia. Protonix  was changed to nexium  40mg  q day. Recommended EGD - 10/16/23 - normal esophagus, normal stomach and normal duodenum. Dilated. Had CT abdomen - 10/14/23 - hepatomegaly with hepatic hemangioma. Evaluated by GI - minimal hepatic steatosis and fibrosis noted on FibroScan. Recommended diet and exercise. Regarding hepatic hemangioma - benign - no further follow up warranted.

## 2024-11-03 NOTE — Assessment & Plan Note (Signed)
 Continue zoloft . Follow. Will notify if she feels needs further intervention.

## 2024-11-03 NOTE — Assessment & Plan Note (Signed)
 Saw Dr Jinny 10/10/23 - f/u dysphagia. Protonix  was changed to nexium  40mg  q day. Recommended EGD - 10/16/23 - normal esophagus, normal stomach and normal duodenum. Dilated. Continue nexium  as outlined.

## 2024-11-03 NOTE — Assessment & Plan Note (Signed)
 Low cholesterol diet and exercise.  Check lipid panel.

## 2024-11-03 NOTE — Assessment & Plan Note (Signed)
 Saw Dr Jinny 10/10/23 - f/u dysphagia. Protonix  was changed to nexium  40mg  q day. Recommended EGD - 10/16/23 - normal esophagus, normal stomach and normal duodenum. Dilated. Had CT abdomen - 10/14/23 - hepatomegaly with hepatic hemangioma. Evaluated by GI - minimal hepatic steatosis and fibrosis noted on FibroScan. Recommended diet and exercise. Regarding hepatic hemangioma - benign - no further follow up warranted. Continue nexium  in am. Taking gaviscon pm. Continue f/u with GI.

## 2025-02-27 ENCOUNTER — Ambulatory Visit: Admitting: Internal Medicine
